# Patient Record
Sex: Female | Born: 1965
Health system: Southern US, Community
[De-identification: ages and names within clinical notes are randomized; demographics above are authoritative.]

## PROBLEM LIST (undated history)

## (undated) DIAGNOSIS — Z789 Other specified health status: Secondary | ICD-10-CM

## (undated) HISTORY — PX: NO PAST SURGERIES: SHX2092

---

## 2003-12-25 ENCOUNTER — Encounter: Admission: RE | Admit: 2003-12-25 | Discharge: 2003-12-25 | Payer: Self-pay | Admitting: Specialist

## 2004-09-07 ENCOUNTER — Ambulatory Visit: Payer: Self-pay | Admitting: General Practice

## 2005-09-06 ENCOUNTER — Ambulatory Visit: Payer: Self-pay | Admitting: Unknown Physician Specialty

## 2005-09-19 ENCOUNTER — Ambulatory Visit: Payer: Self-pay | Admitting: General Practice

## 2006-10-11 ENCOUNTER — Ambulatory Visit: Payer: Self-pay | Admitting: Family Medicine

## 2007-12-12 ENCOUNTER — Ambulatory Visit: Payer: Self-pay | Admitting: Family Medicine

## 2010-05-24 ENCOUNTER — Ambulatory Visit: Payer: Self-pay | Admitting: Family Medicine

## 2010-05-26 ENCOUNTER — Ambulatory Visit: Payer: Self-pay | Admitting: Family Medicine

## 2011-07-06 ENCOUNTER — Ambulatory Visit: Payer: Self-pay | Admitting: Family Medicine

## 2013-11-12 ENCOUNTER — Ambulatory Visit: Payer: Self-pay | Admitting: Family Medicine

## 2015-11-02 ENCOUNTER — Other Ambulatory Visit: Payer: Self-pay | Admitting: Family Medicine

## 2015-11-02 DIAGNOSIS — Z1231 Encounter for screening mammogram for malignant neoplasm of breast: Secondary | ICD-10-CM

## 2015-11-23 ENCOUNTER — Ambulatory Visit: Payer: Self-pay

## 2015-12-06 ENCOUNTER — Other Ambulatory Visit: Payer: Self-pay | Admitting: Family Medicine

## 2015-12-06 ENCOUNTER — Ambulatory Visit
Admission: RE | Admit: 2015-12-06 | Discharge: 2015-12-06 | Disposition: A | Discharge status: Home / Self Care | Payer: Commercial Managed Care - PPO | Source: Ambulatory Visit | Attending: Family Medicine | Admitting: Family Medicine

## 2015-12-06 DIAGNOSIS — Z1231 Encounter for screening mammogram for malignant neoplasm of breast: Secondary | ICD-10-CM | POA: Diagnosis not present

## 2016-03-25 ENCOUNTER — Encounter: Payer: Self-pay | Admitting: Emergency Medicine

## 2016-03-25 ENCOUNTER — Emergency Department
Admission: EM | Admit: 2016-03-25 | Discharge: 2016-03-25 | Disposition: A | Discharge status: Home / Self Care | Payer: Worker's Compensation | Attending: Student in an Organized Health Care Education/Training Program | Admitting: Student in an Organized Health Care Education/Training Program

## 2016-03-25 ENCOUNTER — Emergency Department: Payer: Worker's Compensation

## 2016-03-25 DIAGNOSIS — S61313A Laceration without foreign body of left middle finger with damage to nail, initial encounter: Secondary | ICD-10-CM | POA: Insufficient documentation

## 2016-03-25 DIAGNOSIS — R52 Pain, unspecified: Secondary | ICD-10-CM

## 2016-03-25 DIAGNOSIS — W319XXA Contact with unspecified machinery, initial encounter: Secondary | ICD-10-CM | POA: Diagnosis not present

## 2016-03-25 DIAGNOSIS — Y929 Unspecified place or not applicable: Secondary | ICD-10-CM | POA: Diagnosis not present

## 2016-03-25 DIAGNOSIS — Y990 Civilian activity done for income or pay: Secondary | ICD-10-CM | POA: Insufficient documentation

## 2016-03-25 DIAGNOSIS — Y9389 Activity, other specified: Secondary | ICD-10-CM | POA: Insufficient documentation

## 2016-03-25 MED ORDER — LIDOCAINE HCL (PF) 1 % IJ SOLN
10.0000 mL | Freq: Once | INTRAMUSCULAR | Status: AC
  Administered 2016-03-25: 10 mL
  Filled 2016-03-25: qty 10

## 2016-03-25 MED ORDER — HYDROCODONE-ACETAMINOPHEN 5-325 MG PO TABS: 1.0000 | ORAL_TABLET | ORAL | 0 refills | Status: DC | PRN

## 2016-03-25 MED ORDER — CEPHALEXIN 500 MG PO CAPS: 500.0000 mg | ORAL_CAPSULE | Freq: Three times a day (TID) | ORAL | 0 refills | Status: DC

## 2016-03-25 MED ORDER — HYDROCODONE-ACETAMINOPHEN 5-325 MG PO TABS
1.0000 | ORAL_TABLET | Freq: Once | ORAL | Status: AC
  Administered 2016-03-25: 1 via ORAL
  Filled 2016-03-25: qty 1

## 2016-03-25 NOTE — Discharge Instructions (Signed)
[facility] (Wound Care) Por favor vuelta en [place] puntadas/ grapas quitar o ms pronto si usted tiene preocupaciones.  Mantenga el rea limpia y seca por 24 horas. No quite el vendaje, si est aplicado.  Despus de 24 horas, quita el vendaje y limpia la herida suavemente con cualquier tipo de [place] y agua tibia. Reaplique un vendaje nuevo despus de limpiar herida, si est dirigido.  Limpie la herida con el jabn y agua 1 a 2 veces [facility] se [place].  No aplique ninguna ungentos ni cremas a la herida [employer] las puntadas estn en lugar, pues sta puede causar curativo retrasado.  Notifique la oficina si usted tiene el siguiente de los muestras de la infeccin: Hinchazn, enrojecimiento, calor, drenaje del pus, de la fiebre > 101.0 F  Notifique la oficina si usted tiene la sangra excesiva eso no para despus de 15-20 minutos de presin firme y Brewing technologist.

## 2016-03-25 NOTE — ED Notes (Signed)
See triage note   States she jammed her finger in a machine this am while at work laceration note to tip of left index finger

## 2016-03-25 NOTE — ED Notes (Signed)
Interpreter services paged.

## 2016-03-25 NOTE — ED Notes (Signed)
Left hand placed in saline soak

## 2016-03-25 NOTE — ED Triage Notes (Signed)
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

## 2016-03-25 NOTE — ED Provider Notes (Signed)
[facility] Emergency Department Provider Note  ____________________________________________   First MD Initiated Contact with Patient 03/25/16 [phone]     (approximate)  I have reviewed the triage vital signs and the nursing notes.   HISTORY  Chief Complaint Laceration Per patient and Spanish interpreter.  HPI Leslie Spencer is a 50 y.o. female is here with complaint of laceration to her left third finger. Patient states this is worker's comp injury as she was working on a machine in an attempt to fix it when her finger was pinched. Patient states that she has had a tetanus immunization within last 5 years. She denies any allergies to medications. Currently she rates her pain as a 9/10.   History reviewed. No pertinent past medical history.  There are no active problems to display for this patient.   History reviewed. No pertinent surgical history.  Prior to Admission medications   Medication Sig Start Date End Date Taking? Authorizing Provider  cephALEXin (KEFLEX) 500 MG capsule Take 1 capsule (500 mg total) by mouth 3 (three) times daily. 03/25/16   Johnn Hai, PA-C  HYDROcodone-acetaminophen (NORCO/VICODIN) 5-325 MG tablet Take 1 tablet by mouth every 4 (four) hours as needed for moderate pain. 03/25/16   Johnn Hai, PA-C    Allergies Patient has no known allergies.  No family history on file.  Social History Social History  Substance Use Topics  . Smoking status: Never Smoker  . Smokeless tobacco: Never Used  . Alcohol use No    Review of Systems Constitutional: No fever/chills Cardiovascular: Denies chest pain. Respiratory: Denies shortness of breath. Gastrointestinal:  No nausea, no vomiting.   Musculoskeletal: Positive for pain left third finger. Skin: Positive for laceration. Neurological: Negative for headaches, focal weakness or numbness.  10-point ROS otherwise  negative.  ____________________________________________   PHYSICAL EXAM:  VITAL SIGNS: ED Triage Vitals  Enc Vitals Group     BP 03/25/16 0733 (!) 155/79     Pulse Rate 03/25/16 0733 68     Resp 03/25/16 0733 18     Temp 03/25/16 0733 98.5 F (36.9 C)     Temp Source 03/25/16 0733 Oral     SpO2 03/25/16 0733 100 %     Weight 03/25/16 0737 160 lb (72.6 kg)     Height 03/25/16 0737 5' (1.524 m)     Head Circumference --      Peak Flow --      Pain Score 03/25/16 0738 9     Pain Loc --      Pain Edu? --      Excl. in [place]? --     Constitutional: Alert and oriented. Well appearing and in no acute distress. Eyes: Conjunctivae are normal. PERRL. EOMI. Head: Atraumatic. Nose: No congestion/rhinnorhea. Neck: No stridor.   Cardiovascular: Normal rate, regular rhythm. Grossly normal heart sounds.  Good peripheral circulation. Respiratory: Normal respiratory effort.  No retractions. Lungs CTAB. Musculoskeletal: Moves upper and lower extremities without any difficulty. This laceration to her left third finger distal aspect. Patient is able to flex and extend her finger without any difficulty. Motor sensory function is intact distally. The distal portion of the fingernail is avulsed but attached to soft tissue. There is no active bleeding at this time. No foreign body was noted. Neurologic:  Normal speech and language. No gross focal neurologic deficits are appreciated. No gait instability. Skin:  Skin is warm, dry. Psychiatric: Mood and affect are normal. Speech and behavior are normal.  ____________________________________________   LABS (all labs ordered are listed, but only abnormal results are displayed)  Labs Reviewed - No data to display  RADIOLOGY  Left third finger per radiologist: IMPRESSION:  Probable fracture through the tip of the distal phalanx of the left  middle finger. Densities within the overlying soft tissues could  reflect displaced bone fragments or foreign  bodies.   I, Johnn Hai, personally viewed and evaluated these images (plain radiographs) as part of my medical decision making, as well as reviewing the written report by the radiologist. ____________________________________________   PROCEDURES  Procedure(s) performed: LACERATION REPAIR Performed by: Johnn Hai Authorized by: Johnn Hai Consent: Verbal consent obtained. Risks and benefits: risks, benefits and alternatives were discussed Consent given by: patient Patient identity confirmed: provided demographic data Prepped and Draped in normal sterile fashion Wound explored  Laceration Location: Left third finger distal aspect  Laceration Length: 2.5 cm   No Foreign Bodies seen  Avulsed nail was removed from the skin  Local anesthetic: 1% lidocaine without epi    digital block   Anesthetic total  8  ml  Irrigation method: syringe Amount of cleaning: standard  Skin Closure with 4-0 Ethilon  Number of sutures: 5  Technique: Simple interrupted   Patient tolerance: Patient tolerated the procedure well with no immediate complications.  Procedures  Critical Care performed: No  ____________________________________________   INITIAL IMPRESSION / ASSESSMENT AND PLAN / ED COURSE  Pertinent labs & imaging results that were available during my care of the patient were reviewed by me and considered in my medical decision making (see chart for details).    Clinical Course    Patient tolerated procedure well. She .was given hydrocodone prior to her digital block. Area was soaked with surgical scrub for approximately 20-30 minutes. No active bleeding was present. Area was cleaned and sutured. 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 She is to follow-up with Dr. Mack Guise for follow-up of her finger injury  ____________________________________________   FINAL CLINICAL IMPRESSION(S) / ED  DIAGNOSES  Final diagnoses:  Laceration of left middle finger without foreign body with damage to nail, initial encounter      NEW MEDICATIONS STARTED DURING THIS VISIT:  Discharge Medication List as of 03/25/2016 10:12 AM    START taking these medications   Details  cephALEXin (KEFLEX) 500 MG capsule Take 1 capsule (500 mg total) by mouth 3 (three) times daily., Starting Sat 03/25/2016, Print    HYDROcodone-acetaminophen (NORCO/VICODIN) 5-325 MG tablet Take 1 tablet by mouth every 4 (four) hours as needed for moderate pain., Starting Sat 03/25/2016, Print         Note:  This document was prepared using Dragon voice recognition software and may include unintentional dictation errors.    Johnn Hai, PA-C 03/25/16 1619    Merlyn Lot, MD 03/25/16 [phone]

## 2016-04-03 ENCOUNTER — Encounter: Payer: Self-pay | Admitting: Emergency Medicine

## 2016-04-03 ENCOUNTER — Emergency Department
Admission: EM | Admit: 2016-04-03 | Discharge: 2016-04-03 | Disposition: A | Discharge status: Home / Self Care | Payer: Worker's Compensation | Attending: Emergency Medicine | Admitting: Emergency Medicine

## 2016-04-03 DIAGNOSIS — Z4802 Encounter for removal of sutures: Secondary | ICD-10-CM | POA: Diagnosis present

## 2016-04-03 NOTE — ED Provider Notes (Signed)
[facility] Emergency Department Provider Note  ____________________________________________  Time seen: Approximately 4:58 PM  I have reviewed the triage vital signs and the nursing notes.   HISTORY  Chief Complaint Suture / Staple Removal    HPI Leslie Spencer is a 51 y.o. female who presents emergency department for suture removal. Patient was seen in this department on 03/25/2016 and had 5 sutures placed into the third digit of her left hand. Patient states no cough occasions. She is here for suture removal. No complaints.   History reviewed. No pertinent past medical history.  There are no active problems to display for this patient.   History reviewed. No pertinent surgical history.  Prior to Admission medications   Medication Sig Start Date End Date Taking? Authorizing Provider  cephALEXin (KEFLEX) 500 MG capsule Take 1 capsule (500 mg total) by mouth 3 (three) times daily. 03/25/16   Johnn Hai, PA-C  HYDROcodone-acetaminophen (NORCO/VICODIN) 5-325 MG tablet Take 1 tablet by mouth every 4 (four) hours as needed for moderate pain. 03/25/16   Johnn Hai, PA-C    Allergies Patient has no known allergies.  History reviewed. No pertinent family history.  Social History Social History  Substance Use Topics  . Smoking status: Never Smoker  . Smokeless tobacco: Never Used  . Alcohol use No     Review of Systems  Constitutional: No fever/chills Cardiovascular: no chest pain. Respiratory: no cough. No SOB. Gastrointestinal: No abdominal pain.  No nausea, no vomiting.   Musculoskeletal: Negative for musculoskeletal pain. Skin:Sutured laceration to the third digit left hand. Neurological: Negative for headaches, focal weakness or numbness. 10-point ROS otherwise negative.  ____________________________________________   PHYSICAL EXAM:  VITAL SIGNS: ED Triage Vitals [04/03/16 1620]  Enc Vitals Group     BP 139/72   Pulse Rate 67     Resp 18     Temp 98.7 F (37.1 C)     Temp Source Oral     SpO2 98 %     Weight      Height      Head Circumference      Peak Flow      Pain Score      Pain Loc      Pain Edu?      Excl. in [place]?      Constitutional: Alert and oriented. Well appearing and in no acute distress. Eyes: Conjunctivae are normal. PERRL. EOMI. Head: Atraumatic. Neck: No stridor.    Cardiovascular: Normal rate, regular rhythm. Normal S1 and S2.  Good peripheral circulation. Respiratory: Normal respiratory effort without tachypnea or retractions. Lungs CTAB. Good air entry to the bases with no decreased or absent breath sounds. Musculoskeletal: Full range of motion to all extremities. No gross deformities appreciated. Neurologic:  Normal speech and language. No gross focal neurologic deficits are appreciated.  Skin:  Skin is warm, dry and intact. No rash noted.Sutured laceration noted to the third digit of the left hand. 5 sutures in place. No surrounding erythema or edema concerning for infection. Area is nontender to palpation. Psychiatric: Mood and affect are normal. Speech and behavior are normal. Patient exhibits appropriate insight and judgement.   ____________________________________________   LABS (all labs ordered are listed, but only abnormal results are displayed)  Labs Reviewed - No data to display ____________________________________________  EKG   ____________________________________________  RADIOLOGY   No results found.  ____________________________________________    PROCEDURES  Procedure(s) performed:    .Suture Removal Date/Time: 04/03/2016 5:08 PM Performed  by: CUTHRIELL, JONATHAN D Authorized by: Betha Loa D   Consent:    Consent obtained:  Verbal   Consent given by:  Patient   Risks discussed:  Bleeding, pain and wound separation Location:    Location:  Upper extremity   Upper extremity location:  Hand   Hand location:  L long  finger Procedure details:    Wound appearance:  Good wound healing   Number of sutures removed:  5 Post-procedure details:    Post-removal:  No dressing applied   Patient tolerance of procedure:  Tolerated well, no immediate complications      Medications - No data to display   ____________________________________________   INITIAL IMPRESSION / ASSESSMENT AND PLAN / ED COURSE  Pertinent labs & imaging results that were available during my care of the patient were reviewed by me and considered in my medical decision making (see chart for details).  Review of the [place] CSRS was performed in accordance of the [place] prior to dispensing any controlled drugs.  Clinical Course     Patient's diagnosis is consistent with Suture removal. Laceration is healing appropriately with no complications. 5 sutures are removed.Marland Kitchen She will follow-up primary care as needed Patient is given ED precautions to return to the ED for any worsening or new symptoms.     ____________________________________________  FINAL CLINICAL IMPRESSION(S) / ED DIAGNOSES  Final diagnoses:  Visit for suture removal      NEW MEDICATIONS STARTED DURING THIS VISIT:  New Prescriptions   No medications on file        This chart was dictated using voice recognition software/Dragon. Despite best efforts to proofread, errors can occur which can change the meaning. Any change was purely unintentional.    Darletta Moll, PA-C 04/03/16 1709    Eula Listen, MD 04/03/16 2322

## 2016-04-03 NOTE — ED Triage Notes (Signed)
Pt to ED for suture removal that were placed 1 week ago to left 3rd finger.  Pt denies pain or signs of infection.

## 2017-05-23 ENCOUNTER — Encounter (INDEPENDENT_AMBULATORY_CARE_PROVIDER_SITE_OTHER): Payer: Self-pay

## 2017-05-23 ENCOUNTER — Ambulatory Visit: Payer: PRIVATE HEALTH INSURANCE | Attending: Oncology | Admitting: *Deleted

## 2017-05-23 ENCOUNTER — Other Ambulatory Visit: Payer: Self-pay | Admitting: Family Medicine

## 2017-05-23 ENCOUNTER — Ambulatory Visit
Admission: RE | Admit: 2017-05-23 | Discharge: 2017-05-23 | Disposition: A | Discharge status: Home / Self Care | Payer: PRIVATE HEALTH INSURANCE | Source: Ambulatory Visit | Attending: Family Medicine | Admitting: Family Medicine

## 2017-05-23 DIAGNOSIS — Z1231 Encounter for screening mammogram for malignant neoplasm of breast: Secondary | ICD-10-CM | POA: Insufficient documentation

## 2017-05-23 DIAGNOSIS — Z0000 Encounter for general adult medical examination without abnormal findings: Secondary | ICD-10-CM

## 2017-05-23 DIAGNOSIS — R928 Other abnormal and inconclusive findings on diagnostic imaging of breast: Secondary | ICD-10-CM | POA: Insufficient documentation

## 2017-05-23 NOTE — Progress Notes (Signed)
Patient has insurance.  Not seen through [facility] today.  Sent for her screening mammogram at [facility].  Results to go primary care.

## 2017-05-28 ENCOUNTER — Other Ambulatory Visit: Payer: Self-pay | Admitting: Family Medicine

## 2017-05-28 DIAGNOSIS — N6489 Other specified disorders of breast: Secondary | ICD-10-CM

## 2017-05-28 DIAGNOSIS — R928 Other abnormal and inconclusive findings on diagnostic imaging of breast: Secondary | ICD-10-CM

## 2017-06-25 ENCOUNTER — Ambulatory Visit
Admission: RE | Admit: 2017-06-25 | Discharge: 2017-06-25 | Disposition: A | Discharge status: Home / Self Care | Payer: PRIVATE HEALTH INSURANCE | Source: Ambulatory Visit | Attending: Family Medicine | Admitting: Family Medicine

## 2017-06-25 DIAGNOSIS — N6489 Other specified disorders of breast: Secondary | ICD-10-CM | POA: Diagnosis present

## 2017-06-25 DIAGNOSIS — R928 Other abnormal and inconclusive findings on diagnostic imaging of breast: Secondary | ICD-10-CM

## 2019-01-15 ENCOUNTER — Ambulatory Visit: Payer: Self-pay | Attending: Oncology | Admitting: *Deleted

## 2019-01-15 ENCOUNTER — Encounter: Payer: Self-pay | Admitting: *Deleted

## 2019-01-15 ENCOUNTER — Other Ambulatory Visit: Payer: Self-pay

## 2019-01-15 ENCOUNTER — Ambulatory Visit
Admission: RE | Admit: 2019-01-15 | Discharge: 2019-01-15 | Disposition: A | Discharge status: Home / Self Care | Payer: Self-pay | Source: Ambulatory Visit | Attending: Oncology | Admitting: Oncology

## 2019-01-15 VITALS — BP 138/77 | HR 65 | Temp 98.3°F | Ht 63.0 in | Wt 165.0 lb

## 2019-01-15 DIAGNOSIS — Z0000 Encounter for general adult medical examination without abnormal findings: Secondary | ICD-10-CM

## 2019-01-15 NOTE — Progress Notes (Signed)
  Subjective:     Patient ID: Markeita Truchan, female   DOB: 12/22/1965, 53 y.o.   MRN: [id number]  HPI   Review of Systems     Objective:   Physical Exam Chest:     Breasts:        Right: No swelling, bleeding, inverted nipple, mass, nipple discharge, skin change or tenderness.        Left: No swelling, bleeding, inverted nipple, mass, nipple discharge, skin change or tenderness.    Lymphadenopathy:     Upper Body:     Right upper body: No supraclavicular or axillary adenopathy.     Left upper body: No supraclavicular or axillary adenopathy.        Assessment:     53 year old Hispanic female returns to [facility] for annual screening.  Lloyda, the interpreter present during the interview and exam.  On clinical breast exam bilateral breast have very dense thickening at upper outer quadrants.  There is no dominant mass, skin changes, nipple discharge or lymphadenopathy.  Taught self breast awareness.  Last pap on 10/23/18 was negative without HPV co-testing.  Next pap due in 2023.  Tyrer-Cuzick breast cancer risk assessment with a lifetime risk of 8.5%.  Per NCCN guidelines no further imagaing or genetic testing is recommended. Patient has been screened for eligibility.  She does not have any insurance, Medicare or Medicaid.  She also meets financial eligibility.  Hand-out given on the Affordable Care Act.    Plan:     Screening mammogram ordered.  Will follow up per [facility] protocol.

## 2019-01-21 ENCOUNTER — Encounter: Payer: Self-pay | Admitting: *Deleted

## 2019-01-22 ENCOUNTER — Ambulatory Visit (LOCAL_COMMUNITY_HEALTH_CENTER): Payer: Self-pay

## 2019-01-22 ENCOUNTER — Other Ambulatory Visit: Payer: Self-pay

## 2019-01-22 DIAGNOSIS — Z23 Encounter for immunization: Secondary | ICD-10-CM

## 2019-01-23 ENCOUNTER — Encounter: Payer: Self-pay | Admitting: *Deleted

## 2019-01-23 NOTE — Progress Notes (Signed)
Letter mailed from [facility] to inform patient of her normal mammogram results.  Patient is to follow-up with annual screening in one year.  HSIS to Christy. 

## 2021-01-19 ENCOUNTER — Ambulatory Visit: Payer: Self-pay | Admitting: Family Medicine

## 2021-01-20 ENCOUNTER — Encounter: Payer: Self-pay | Admitting: Nurse Practitioner

## 2021-01-20 ENCOUNTER — Other Ambulatory Visit: Payer: Self-pay

## 2021-01-20 ENCOUNTER — Ambulatory Visit (INDEPENDENT_AMBULATORY_CARE_PROVIDER_SITE_OTHER): Payer: BC Managed Care – PPO | Admitting: Nurse Practitioner

## 2021-01-20 VITALS — BP 127/79 | HR 63 | Temp 99.0°F | Ht 62.5 in | Wt 164.0 lb

## 2021-01-20 DIAGNOSIS — B351 Tinea unguium: Secondary | ICD-10-CM | POA: Diagnosis not present

## 2021-01-20 DIAGNOSIS — Z23 Encounter for immunization: Secondary | ICD-10-CM

## 2021-01-20 DIAGNOSIS — Z0000 Encounter for general adult medical examination without abnormal findings: Secondary | ICD-10-CM

## 2021-01-20 DIAGNOSIS — Z6829 Body mass index (BMI) 29.0-29.9, adult: Secondary | ICD-10-CM | POA: Diagnosis not present

## 2021-01-20 DIAGNOSIS — Z7689 Persons encountering health services in other specified circumstances: Secondary | ICD-10-CM

## 2021-01-20 DIAGNOSIS — Z136 Encounter for screening for cardiovascular disorders: Secondary | ICD-10-CM

## 2021-01-20 DIAGNOSIS — Z1231 Encounter for screening mammogram for malignant neoplasm of breast: Secondary | ICD-10-CM

## 2021-01-20 DIAGNOSIS — Z1211 Encounter for screening for malignant neoplasm of colon: Secondary | ICD-10-CM

## 2021-01-20 NOTE — Patient Instructions (Signed)
[facility] at [facility]  Address: [address] [place], [place], [place] [address]  Phone: [phone]   [place] es una [place]. Se hace para comprobar si hay cambios que no son normales. Este estudio permite buscar cambios que puedan estar causados por el cncer de mama u otros problemas. Las [employer] se Herbalist a las mujeres a Proofreader de los 39 aos. Un hombre puede hacerse una mamografa si tiene un bulto o hinchazn en la mama. Informe al mdico: Acerca de cualquier alergia que tenga. Si tiene implantes mamarios. Si ha tenido enfermedades, biopsias o cirugas de Scientist, research (medical). Si tiene antecedentes familiares de cncer de mama. Si est amamantando. Si est embarazada o podra estarlo. Cules son los riesgos? En general, se trata de un procedimiento seguro. Sin embargo, pueden ocurrir problemas, por ejemplo: Exponerse a la radiacin. En [employer], los [place] de radiacin son muy bajos. La necesidad de ms pruebas. Los resultados no se Chartered loss adjuster. Problemas para encontrar cncer de mama en mujeres con mamas densas. Qu ocurre antes de esta prueba? Hgase este estudio aproximadamente 1 o 2 semanas despus del perodo menstrual. Con frecuencia, es cuando las mamas estn menos sensibles. Si consulta a un mdico nuevo o cambia de Chief of Staff, enve las [employer] anteriores al [employer]. El da del examen, lave sus mamas y la zona de las [place]. No use desodorantes, perfumes, lociones o talcos el da del estudio. Qutese las alhajas del cuello. Use prendas que pueda ponerse y sacarse fcilmente. Qu ocurre durante la prueba?  Deber quitarse la ropa de la cintura para [place]. Se colocar una bata. Debe permanecer de pie delante de la mquina de radiografas. Se colocar cada mama entre dos placas de [place] unos segundos. Trate de  relajarse. Esto no causa ningn dao a las mamas. Puede que no resulte cmodo, pero ser muy breve. Se tomarn radiografas desde diferentes ngulos de cada mama. Este procedimiento puede variar segn el mdico y el hospital. Qu puedo esperar despus de la prueba? Ron Parker ser leda por un especialista (radilogo). Tal vez deba repetir [employer] del estudio. Esto depende de la calidad de las imgenes. Puede volver a sus actividades habituales. Es su responsabilidad retirar los [employer]. Pregunte cmo obtener sus resultados cuando estn listos. Resumen [employer] es una radiografa de las [place]. Permite buscar cambios que puedan estar causados por el cncer de mama u otros problemas. Un hombre puede [employer] examen si tiene un bulto o hinchazn en la mama. Antes del estudio, informe al mdico sobre cualquier problema en las mamas que haya tenido en el pasado. Hgase este estudio aproximadamente 1 o 2 semanas despus del perodo menstrual. Pregunte la fecha en que los resultados estarn disponibles. Asegrese de [employer]. Esta informacin no tiene Marine scientist el consejo del mdico. Asegrese de hacerle al mdico cualquier pregunta que tenga. Document Revised: 01/27/2020 Document Reviewed: 01/27/2020 Elsevier Patient Education  2022 [employer].

## 2021-01-20 NOTE — Assessment & Plan Note (Signed)
Ongoing issue with poor response to external treatment.  Will place referral to podiatry and check CMP and TSH today.

## 2021-01-20 NOTE — Progress Notes (Signed)
New Patient Office Visit  Subjective:  Patient ID: Leslie Spencer, female    DOB: 1965-06-30  Age: 55 y.o. MRN: [id number]  CC:  Chief Complaint  Patient presents with   Establish Care    Patient is here to establish care. Patient denies having any concerns at today's visit.    Skin Problem    Patient states says about 6 months ago she has a biopsy from her face in [place] and it was cancerous. Patient states she has the skin removed for Cosmetic purposes and then was informed that it was cancerous. Patient daughter states she had it removed here in [place] at Dermatology office. Patient still has her check up/follow up appointments.     HPI Leslie Spencer presents for new patient visit to establish care.  Introduced to Designer, jewellery role and practice setting.  All questions answered.  Discussed provider/patient relationship and expectations.   Previously followed by [facility] and last seen one year ago.  Verdis Frederickson, daughter, [phone], main contact.  Interpreter at bedside.  She wishes to obtain physical -- has not had in 2 years.  Reports last pap two years, noted in records 10/23/2018 = no HPV testing.  Has mammograms up to date, last was in November 2021 -- asymmetry in superior right breast, was rechecked -- to return in one year.    Has ongoing onychomycosis to left toenails and reports being treated with external lacquer from [facility] with no benefit.   SKIN LESION She is followed by dermatology for an area on her face that was cancerous. Duration: months Location: to face Painful: no Itching: no Onset: gradual Context: not changing Associated signs and symptoms:  History of skin cancer: yes History of precancerous skin lesions: no Family history of skin cancer: no   History reviewed. No pertinent past medical history.  Past Surgical History:  Procedure Laterality Date   CESAREAN SECTION     NO PAST SURGERIES      Family History  Problem Relation Age  of Onset   Heart disease Father     Social History   Socioeconomic History   Marital status: Married    Spouse name: Not on file   Number of children: Not on file   Years of education: Not on file   Highest education level: Not on file  Occupational History   Not on file  Tobacco Use   Smoking status: Never   Smokeless tobacco: Never  Substance and Sexual Activity   Alcohol use: No   Drug use: No   Sexual activity: Yes  Other Topics Concern   Not on file  Social History Narrative   Not on file   Social Determinants of Health   Financial Resource Strain: Low Risk    Difficulty of Paying Living Expenses: Not hard at all  Food Insecurity: No Food Insecurity   Worried About Charity fundraiser in the Last Year: Never true   Ran Out of Food in the Last Year: Never true  Transportation Needs: No Transportation Needs   Lack of Transportation (Medical): No   Lack of Transportation (Non-Medical): No  Physical Activity: Sufficiently Active   Days of Exercise per Week: 5 days   Minutes of Exercise per Session: 30 min  Stress: No Stress Concern Present   Feeling of Stress : Not at all  Social Connections: Moderately Isolated   Frequency of Communication with Friends and Family: More than three times a week   Frequency of Social Gatherings  with Friends and Family: More than three times a week   Attends Religious Services: Never   Marine scientist or Organizations: No   Attends Music therapist: Never   Marital Status: Married  Human resources officer Violence: Not At Risk   Fear of Current or Ex-Partner: No   Emotionally Abused: No   Physically Abused: No   Sexually Abused: No    ROS Review of Systems  Constitutional:  Negative for activity change, appetite change, diaphoresis, fatigue and fever.  Respiratory:  Negative for cough, chest tightness and shortness of breath.   Cardiovascular:  Negative for chest pain, palpitations and leg swelling.   Gastrointestinal: Negative.   Neurological:  Negative for dizziness, syncope, weakness, light-headedness, numbness and headaches.  Psychiatric/Behavioral: Negative.     Objective:   Today's Vitals: BP 127/79   Pulse 63   Temp 99 F (37.2 C) (Oral)   Ht 5' 2.5" (1.588 m)   Wt 164 lb (74.4 kg)   SpO2 98%   BMI 29.52 kg/m   Physical Exam Vitals and nursing note reviewed.  Constitutional:      General: She is awake. She is not in acute distress.    Appearance: She is well-developed and well-groomed. She is not ill-appearing or toxic-appearing.  HENT:     Head: Normocephalic.     Right Ear: Hearing normal.     Left Ear: Hearing normal.     Nose: Nose normal.  Eyes:     General: Lids are normal.        Right eye: No discharge.        Left eye: No discharge.     Conjunctiva/sclera: Conjunctivae normal.     Pupils: Pupils are equal, round, and reactive to light.  Neck:     Thyroid: No thyromegaly.     Vascular: No carotid bruit.  Cardiovascular:     Rate and Rhythm: Normal rate and regular rhythm.     Heart sounds: Normal heart sounds. No murmur heard.   No gallop.  Pulmonary:     Effort: Pulmonary effort is normal. No accessory muscle usage or respiratory distress.     Breath sounds: Normal breath sounds.  Abdominal:     General: Bowel sounds are normal.     Palpations: Abdomen is soft. There is no hepatomegaly or splenomegaly.  Musculoskeletal:     Cervical back: Normal range of motion and neck supple.     Right lower leg: No edema.     Left lower leg: No edema.  Lymphadenopathy:     Cervical: No cervical adenopathy.  Skin:    General: Skin is warm and dry.  Neurological:     Mental Status: She is alert and oriented to person, place, and time.     Deep Tendon Reflexes: Reflexes are normal and symmetric.     Reflex Scores:      Brachioradialis reflexes are 2+ on the right side and 2+ on the left side.      Patellar reflexes are 2+ on the right side and 2+ on the  left side. Psychiatric:        Attention and Perception: Attention normal.        Mood and Affect: Mood normal.        Speech: Speech normal.        Behavior: Behavior normal. Behavior is cooperative.        Thought Content: Thought content normal.    Assessment & Plan:   Problem List  Items Addressed This Visit       Musculoskeletal and Integument   Onychomycosis - Primary    Ongoing issue with poor response to external treatment.  Will place referral to podiatry and check CMP and TSH today.      Relevant Orders   Ambulatory referral to Podiatry     Other   BMI 29.0-29.9,adult    Recommended eating smaller high protein, low fat meals more frequently and exercising 30 mins a day 5 times a week with a goal of 10-15lb weight loss in the next 3 months. Patient voiced their understanding and motivation to adhere to these recommendations.       Other Visit Diagnoses     Encounter for screening mammogram for malignant neoplasm of breast       Mammogram ordered today   Relevant Orders   MM 3D SCREEN BREAST BILATERAL   Colon cancer screening       Referral to GI placed   Relevant Orders   Ambulatory referral to Gastroenterology   Flu vaccine need       Flu vaccine provided today   Relevant Orders   Flu Vaccine QUAD 6+ mos PF IM (Fluarix Quad PF) (Completed)   Encounter for screening for cardiovascular disorders       Lipid panel obtained.     Relevant Orders   Comprehensive metabolic panel   Lipid Panel w/o Chol/HDL Ratio   Encounter for annual physical exam       Annual physical today and health maintenance reviewed.   Relevant Orders   CBC with Differential/Platelet   TSH       Outpatient Encounter Medications as of 01/20/2021  Medication Sig   loratadine (CLARITIN) 10 MG tablet Take 10 mg by mouth daily.   [DISCONTINUED] cephALEXin (KEFLEX) 500 MG capsule Take 1 capsule (500 mg total) by mouth 3 (three) times daily. (Patient not taking: Reported on 01/20/2021)    [DISCONTINUED] HYDROcodone-acetaminophen (NORCO/VICODIN) 5-325 MG tablet Take 1 tablet by mouth every 4 (four) hours as needed for moderate pain. (Patient not taking: Reported on 01/20/2021)   No facility-administered encounter medications on file as of 01/20/2021.    Follow-up: Return in about 1 year (around 01/20/2022) for Annual physical with pap due.   Venita Lick, NP

## 2021-01-20 NOTE — Assessment & Plan Note (Signed)
Recommended eating smaller high protein, low fat meals more frequently and exercising 30 mins a day 5 times a week with a goal of 10-15lb weight loss in the next 3 months. Patient voiced their understanding and motivation to adhere to these recommendations.  

## 2021-01-21 ENCOUNTER — Other Ambulatory Visit: Payer: Self-pay

## 2021-01-21 DIAGNOSIS — Z1211 Encounter for screening for malignant neoplasm of colon: Secondary | ICD-10-CM

## 2021-01-21 LAB — CBC WITH DIFFERENTIAL/PLATELET
Basophils Absolute: 0.1 10*3/uL (ref 0.0–0.2)
Basos: 1 %
EOS (ABSOLUTE): 0.1 10*3/uL (ref 0.0–0.4)
Eos: 2 %
Hematocrit: 42.6 % (ref 34.0–46.6)
Hemoglobin: 14.5 g/dL (ref 11.1–15.9)
Immature Grans (Abs): 0 10*3/uL (ref 0.0–0.1)
Immature Granulocytes: 0 %
Lymphocytes Absolute: 1.9 10*3/uL (ref 0.7–3.1)
Lymphs: 27 %
MCH: 30.7 pg (ref 26.6–33.0)
MCHC: 34 g/dL (ref 31.5–35.7)
MCV: 90 fL (ref 79–97)
Monocytes Absolute: 0.5 10*3/uL (ref 0.1–0.9)
Monocytes: 7 %
Neutrophils Absolute: 4.6 10*3/uL (ref 1.4–7.0)
Neutrophils: 63 %
Platelets: 311 10*3/uL (ref 150–450)
RBC: 4.73 x10E6/uL (ref 3.77–5.28)
RDW: 12.1 % (ref 11.7–15.4)
WBC: 7.2 10*3/uL (ref 3.4–10.8)

## 2021-01-21 LAB — COMPREHENSIVE METABOLIC PANEL
ALT: 24 IU/L (ref 0–32)
AST: 31 IU/L (ref 0–40)
Albumin/Globulin Ratio: 2.1 (ref 1.2–2.2)
Albumin: 4.8 g/dL (ref 3.8–4.9)
Alkaline Phosphatase: 111 IU/L (ref 44–121)
BUN/Creatinine Ratio: 24 — ABNORMAL HIGH (ref 9–23)
BUN: 15 mg/dL (ref 6–24)
Bilirubin Total: 0.3 mg/dL (ref 0.0–1.2)
CO2: 24 mmol/L (ref 20–29)
Calcium: 9.7 mg/dL (ref 8.7–10.2)
Chloride: 101 mmol/L (ref 96–106)
Creatinine, Ser: 0.62 mg/dL (ref 0.57–1.00)
Globulin, Total: 2.3 g/dL (ref 1.5–4.5)
Glucose: 103 mg/dL — ABNORMAL HIGH (ref 70–99)
Potassium: 4.4 mmol/L (ref 3.5–5.2)
Sodium: 140 mmol/L (ref 134–144)
Total Protein: 7.1 g/dL (ref 6.0–8.5)
eGFR: 104 mL/min/{1.73_m2} (ref >59)

## 2021-01-21 LAB — TSH: TSH: 1.51 u[IU]/mL (ref 0.450–4.500)

## 2021-01-21 LAB — LIPID PANEL W/O CHOL/HDL RATIO
Cholesterol, Total: 272 mg/dL — ABNORMAL HIGH (ref 100–199)
HDL: 59 mg/dL (ref >39)
LDL Chol Calc (NIH): 173 mg/dL — ABNORMAL HIGH (ref 0–99)
Triglycerides: 216 mg/dL — ABNORMAL HIGH (ref 0–149)
VLDL Cholesterol Cal: 40 mg/dL (ref 5–40)

## 2021-01-21 MED ORDER — NA SULFATE-K SULFATE-MG SULF 17.5-3.13-1.6 GM/177ML PO SOLN: 1.0000 | Freq: Once | ORAL | 0 refills | Status: AC

## 2021-01-21 NOTE — Progress Notes (Signed)
Contacted via [place] The 10-year ASCVD risk score (Arnett DK, et al., 2019) is: 2.8%   Values used to calculate the score:     Age: 55 years     Sex: Female     Is Non-Hispanic African American: No     Diabetic: No     Tobacco smoker: No     Systolic Blood Pressure: 832 mmHg     Is BP treated: No     HDL Cholesterol: 59 mg/dL     Total Cholesterol: 272 mg/dL     [place], tus laboratorios han regresado y en general se ven fantsticos con la excepcin de los niveles de [place]. Su colesterol es alto, pero recomendaciones para hacer cambios de estilo de vida. Su LDL est por encima de lo normal. El LDL es el colesterol malo. Con el tiempo y en combinacin con la inflamacin y otros factores, esto contribuye a la formacin de placa que, a su vez, puede provocar un derrame cerebral y/o un ataque al corazn en el futuro. A veces, el LDL alto es principalmente gentico, y las personas pueden estar comiendo todos los alimentos correctos pero an tienen nmeros altos. Otras veces, hay margen para mejorar la dieta y comer ms sano puede reducir este nmero y reducir potencialmente el riesgo de ataque cardaco y/o accidente cerebrovascular.  Para reducir su LDL, recuerde: ms frutas y verduras, ms pescado y limite las carnes rojas y los productos lcteos. Ms soya, nueces, frijoles, cebada, lentejas, avena y [place] enriquecida con steres de Teaching laboratory technician de [place]. Tambin animo a eliminar el azcar y los alimentos procesados. Recuerde, compre afuera de la tienda de comestibles y visite su Personal assistant. Si desea hablar conmigo [facility] dieta para su colesterol, hgamelo saber. Deberamos volver a Chartered loss adjuster en 12 meses. Alguna pregunta? Sigue siendo genial!! Gracias por permitirme participar en su cuidado. Te aprecio. Saludos cordiales, [place] Streat

## 2021-01-21 NOTE — Progress Notes (Signed)
Gastroenterology Pre-Procedure Review  Request Date: 01/27/21 Requesting Physician: Dr. Allen Norris   PATIENT REVIEW QUESTIONS: The patient responded to the following health history questions as indicated:  Please contact patients daughter Verdis Frederickson. Daughter answered all questions for patient.  1. Are you having any GI issues? no 2. Do you have a personal history of Polyps? no 3. Do you have a family history of Colon Cancer or Polyps? no 4. Diabetes Mellitus? no 5. Joint replacements in the past 12 months?no 6. Major health problems in the past 3 months?no 7. Any artificial heart valves, MVP, or defibrillator?no    MEDICATIONS & ALLERGIES:    Patient reports the following regarding taking any anticoagulation/antiplatelet therapy:   Plavix, Coumadin, Eliquis, Xarelto, Lovenox, Pradaxa, Brilinta, or Effient? no Aspirin? no  Patient confirms/reports the following medications:  Current Outpatient Medications  Medication Sig Dispense Refill   loratadine (CLARITIN) 10 MG tablet Take 10 mg by mouth daily.     No current facility-administered medications for this visit.    Patient confirms/reports the following allergies:  No Known Allergies  No orders of the defined types were placed in this encounter.   AUTHORIZATION INFORMATION Primary Insurance: 1D#: Group #:  Secondary Insurance: 1D#: Group #:  SCHEDULE INFORMATION: Date: 01/27/21  Time: Location: [place]

## 2021-01-24 ENCOUNTER — Encounter: Payer: Self-pay | Admitting: Gastroenterology

## 2021-01-27 ENCOUNTER — Ambulatory Visit: Payer: BC Managed Care – PPO | Admitting: Anesthesiology

## 2021-01-27 ENCOUNTER — Other Ambulatory Visit: Payer: Self-pay

## 2021-01-27 ENCOUNTER — Encounter
Admission: RE | Disposition: A | Discharge status: Home / Self Care | Payer: Self-pay | Source: Ambulatory Visit | Attending: Gastroenterology

## 2021-01-27 ENCOUNTER — Encounter: Payer: Self-pay | Admitting: Gastroenterology

## 2021-01-27 ENCOUNTER — Ambulatory Visit
Admission: RE | Admit: 2021-01-27 | Discharge: 2021-01-27 | Disposition: A | Discharge status: Home / Self Care | Payer: BC Managed Care – PPO | Source: Ambulatory Visit | Attending: Gastroenterology | Admitting: Gastroenterology

## 2021-01-27 DIAGNOSIS — D124 Benign neoplasm of descending colon: Secondary | ICD-10-CM | POA: Diagnosis not present

## 2021-01-27 DIAGNOSIS — K640 First degree hemorrhoids: Secondary | ICD-10-CM | POA: Diagnosis not present

## 2021-01-27 DIAGNOSIS — K635 Polyp of colon: Secondary | ICD-10-CM | POA: Diagnosis not present

## 2021-01-27 DIAGNOSIS — Z1211 Encounter for screening for malignant neoplasm of colon: Secondary | ICD-10-CM | POA: Diagnosis not present

## 2021-01-27 DIAGNOSIS — D122 Benign neoplasm of ascending colon: Secondary | ICD-10-CM | POA: Insufficient documentation

## 2021-01-27 HISTORY — PX: COLONOSCOPY WITH PROPOFOL: SHX5780

## 2021-01-27 HISTORY — PX: POLYPECTOMY: SHX5525

## 2021-01-27 HISTORY — DX: Other specified health status: Z78.9

## 2021-01-27 LAB — POCT PREGNANCY, URINE: Preg Test, Ur: NEGATIVE

## 2021-01-27 SURGERY — COLONOSCOPY WITH PROPOFOL
Anesthesia: General | Site: Rectum

## 2021-01-27 MED ORDER — ACETAMINOPHEN 325 MG PO TABS: 325.0000 mg | ORAL_TABLET | ORAL | Status: DC | PRN

## 2021-01-27 MED ORDER — STERILE WATER FOR IRRIGATION IR SOLN
Status: DC | PRN
  Administered 2021-01-27: 250 mL

## 2021-01-27 MED ORDER — SODIUM CHLORIDE 0.9 % IV SOLN: INTRAVENOUS | Status: DC

## 2021-01-27 MED ORDER — ACETAMINOPHEN 160 MG/5ML PO SOLN: 325.0000 mg | ORAL | Status: DC | PRN

## 2021-01-27 MED ORDER — LACTATED RINGERS IV SOLN: INTRAVENOUS | Status: DC

## 2021-01-27 MED ORDER — ONDANSETRON HCL 4 MG/2ML IJ SOLN: 4.0000 mg | Freq: Once | INTRAMUSCULAR | Status: DC | PRN

## 2021-01-27 MED ORDER — PROPOFOL 10 MG/ML IV BOLUS
INTRAVENOUS | Status: DC | PRN
  Administered 2021-01-27: 80 mg via INTRAVENOUS
  Administered 2021-01-27 (×7): 20 mg via INTRAVENOUS

## 2021-01-27 MED ORDER — LIDOCAINE HCL (CARDIAC) PF 100 MG/5ML IV SOSY
PREFILLED_SYRINGE | INTRAVENOUS | Status: DC | PRN
  Administered 2021-01-27: 50 mg via INTRAVENOUS

## 2021-01-27 SURGICAL SUPPLY — 8 items
GOWN CVR UNV OPN BCK APRN NK (MISCELLANEOUS) ×4 IMPLANT
GOWN ISOL THUMB LOOP REG UNIV (MISCELLANEOUS) ×6
KIT PRC NS LF DISP ENDO (KITS) ×2 IMPLANT
KIT PROCEDURE OLYMPUS (KITS) ×3
MANIFOLD NEPTUNE II (INSTRUMENTS) ×3 IMPLANT
SNARE COLD EXACTO (MISCELLANEOUS) ×3 IMPLANT
TRAP ETRAP POLY (MISCELLANEOUS) ×2 IMPLANT
WATER STERILE IRR 250ML POUR (IV SOLUTION) ×3 IMPLANT

## 2021-01-27 NOTE — Transfer of Care (Signed)
Immediate Anesthesia Transfer of Care Note  Patient: Leslie Spencer  Procedure(s) Performed: COLONOSCOPY WITH PROPOFOL (Rectum) POLYPECTOMY (Rectum)  Patient Location: PACU  Anesthesia Type: General  Level of Consciousness: awake, alert  and patient cooperative  Airway and Oxygen Therapy: Patient Spontanous Breathing and Patient connected to supplemental oxygen  Post-op Assessment: Post-op Vital signs reviewed, Patient's Cardiovascular Status Stable, Respiratory Function Stable, Patent Airway and No signs of Nausea or vomiting  Post-op Vital Signs: Reviewed and stable  Complications: No notable events documented.

## 2021-01-27 NOTE — H&P (Signed)
Leslie Lame, MD [facility] [address], [place],  [address] Phone: [phone] Fax : [phone]  Primary Care Physician:  Venita Lick, NP Primary Gastroenterologist:  Dr. Allen Norris  Pre-Procedure History & Physical: HPI:  Leslie Spencer is a 55 y.o. female is here for a screening colonoscopy.   Past Medical History:  Diagnosis Date   Medical history non-contributory     Past Surgical History:  Procedure Laterality Date   CESAREAN SECTION      Prior to Admission medications   Medication Sig Start Date End Date Taking? Authorizing Provider  COLLAGEN PO Take by mouth daily.   Yes [provider]  loratadine (CLARITIN) 10 MG tablet Take 10 mg by mouth daily.   Yes [provider]    Allergies as of 01/21/2021   (No Known Allergies)    Family History  Problem Relation Age of Onset   Heart disease Father     Social History   Socioeconomic History   Marital status: Married    Spouse name: Not on file   Number of children: Not on file   Years of education: Not on file   Highest education level: Not on file  Occupational History   Not on file  Tobacco Use   Smoking status: Never   Smokeless tobacco: Never  Vaping Use   Vaping Use: Never used  Substance and Sexual Activity   Alcohol use: No   Drug use: No   Sexual activity: Yes  Other Topics Concern   Not on file  Social History Narrative   Not on file   Social Determinants of Health   Financial Resource Strain: Low Risk    Difficulty of Paying Living Expenses: Not hard at all  Food Insecurity: No Food Insecurity   Worried About Charity fundraiser in the Last Year: Never true   [place] in the Last Year: Never true  Transportation Needs: No Transportation Needs   Lack of Transportation (Medical): No   Lack of Transportation (Non-Medical): No  Physical Activity: Sufficiently Active   Days of Exercise per Week: 5 days   Minutes of Exercise per Session: 30 min   Stress: No Stress Concern Present   Feeling of Stress : Not at all  Social Connections: Moderately Isolated   Frequency of Communication with Friends and Family: More than three times a week   Frequency of Social Gatherings with Friends and Family: More than three times a week   Attends Religious Services: Never   Marine scientist or Organizations: No   Attends Music therapist: Never   Marital Status: Married  Human resources officer Violence: Not At Risk   Fear of Current or Ex-Partner: No   Emotionally Abused: No   Physically Abused: No   Sexually Abused: No    Review of Systems: See HPI, otherwise negative ROS  Physical Exam: BP (!) 149/77   Pulse 64   Temp 97.8 F (36.6 C) (Temporal)   Ht 5' 2.5" (1.588 m)   Wt 72.6 kg   LMP 08/08/2020 (Approximate)   SpO2 97%   BMI 28.80 kg/m  General:   Alert,  pleasant and cooperative in NAD Head:  Normocephalic and atraumatic. Neck:  Supple; no masses or thyromegaly. Lungs:  Clear throughout to auscultation.    Heart:  Regular rate and rhythm. Abdomen:  Soft, nontender and nondistended. Normal bowel sounds, without guarding, and without rebound.   Neurologic:  Alert and  oriented x4;  grossly normal neurologically.  Impression/Plan: Leslie Spencer is now here to undergo a screening colonoscopy.  Risks, benefits, and alternatives regarding colonoscopy have been reviewed with the patient.  Questions have been answered.  All parties agreeable.

## 2021-01-27 NOTE — Op Note (Signed)
[facility]astroenter[facility] Patient Name: Leslie Spencer Procedure Date: 01/27/2021 10:39 AM MRN: [id number] Account #: [id number] Date of Birth: 02-08-1966 Admit Type: Outpatient Age: 55 Room: [facility] OR ROOM 01 Gender: Female Note Status: Finalized Instrument Name: [id number] Procedure:             Colonoscopy Indications:           Screening for colorectal malignant neoplasm Providers:             Lucilla Lame MD, MD Referring MD:          Barbaraann Faster. Cannady (Referring MD) Medicines:             Propofol per Anesthesia Complications:         No immediate complications. Procedure:             Pre-Anesthesia Assessment:                        - Prior to the procedure, a History and Physical was                         performed, and patient medications and allergies were                         reviewed. The patient's tolerance of previous                         anesthesia was also reviewed. The risks and benefits                         of the procedure and the sedation options and risks                         were discussed with the patient. All questions were                         answered, and informed consent was obtained. Prior                         Anticoagulants: The patient has taken no previous                         anticoagulant or antiplatelet agents. ASA Grade                         Assessment: II - A patient with mild systemic disease.                         After reviewing the risks and benefits, the patient                         was deemed in satisfactory condition to undergo the                         procedure.                        After obtaining informed consent, the colonoscope was  passed under direct vision. Throughout the procedure,                         the patient's blood pressure, pulse, and oxygen                         saturations were monitored continuously. The                         Colonoscope  was introduced through the anus and                         advanced to the the cecum, identified by appendiceal                         orifice and ileocecal valve. The colonoscopy was                         performed without difficulty. The patient tolerated                         the procedure well. The quality of the bowel                         preparation was excellent. Findings:      The perianal and digital rectal examinations were normal.      Two sessile polyps were found in the ascending colon. The polyps were 3       to 7 mm in size. These polyps were removed with a cold snare. Resection       and retrieval were complete.      A 3 mm polyp was found in the descending colon. The polyp was sessile.       The polyp was removed with a cold snare. Resection and retrieval were       complete.      Non-bleeding internal hemorrhoids were found during retroflexion. The       hemorrhoids were Grade I (internal hemorrhoids that do not prolapse). Impression:            - Two 3 to 7 mm polyps in the ascending colon, removed                         with a cold snare. Resected and retrieved.                        - One 3 mm polyp in the descending colon, removed with                         a cold snare. Resected and retrieved.                        - Non-bleeding internal hemorrhoids. Recommendation:        - Discharge patient to home.                        - Resume previous diet.                        - Continue present medications.                        -  Await pathology results.                        - If the pathology report reveals adenomatous tissue,                         then repeat the colonoscopy for surveillance in 5                         years. Procedure Code(s):     --- Professional ---                        [phone], Colonoscopy, flexible; with removal of                         tumor(s), polyp(s), or other lesion(s) by snare                          technique Diagnosis Code(s):     --- Professional ---                        Z12.11, Encounter for screening for malignant neoplasm                         of colon                        K63.5, Polyp of colon CPT copyright 2019 American Medical Association. All rights reserved. The codes documented in this report are preliminary and upon coder review may  be revised to meet current compliance requirements. Lucilla Lame MD, MD 01/27/2021 11:03:04 AM This report has been signed electronically. Number of Addenda: 0 Note Initiated On: 01/27/2021 10:39 AM Scope Withdrawal Time: 0 hours 8 minutes 26 seconds  Total Procedure Duration: 0 hours 12 minutes 30 seconds  Estimated Blood Loss:  Estimated blood loss: none.      [facility]

## 2021-01-27 NOTE — Anesthesia Postprocedure Evaluation (Signed)
Anesthesia Post Note  Patient: Leslie Spencer  Procedure(s) Performed: COLONOSCOPY WITH PROPOFOL (Rectum) POLYPECTOMY (Rectum)     Patient location during evaluation: PACU Anesthesia Type: General Level of consciousness: awake Pain management: pain level controlled Vital Signs Assessment: post-procedure vital signs reviewed and stable Respiratory status: respiratory function stable Cardiovascular status: stable Postop Assessment: no signs of nausea or vomiting Anesthetic complications: no   No notable events documented.  Veda Canning

## 2021-01-27 NOTE — Anesthesia Preprocedure Evaluation (Addendum)
Anesthesia Evaluation  Patient identified by MRN, date of birth, ID band Patient awake    Reviewed: Allergy & Precautions, NPO status   Airway Mallampati: II  TM Distance: >3 FB     Dental   Pulmonary neg pulmonary ROS,    Pulmonary exam normal        Cardiovascular negative cardio ROS   Rhythm:Regular Rate:Normal     Neuro/Psych    GI/Hepatic negative GI ROS,   Endo/Other    Renal/GU      Musculoskeletal   Abdominal   Peds  Hematology   Anesthesia Other Findings   Reproductive/Obstetrics                             Anesthesia Physical Anesthesia Plan  ASA: 1  Anesthesia Plan: General   Post-op Pain Management:    Induction: Intravenous  PONV Risk Score and Plan: Propofol infusion, TIVA and Treatment may vary due to age or medical condition  Airway Management Planned: Natural Airway and Nasal Cannula  Additional Equipment:   Intra-op Plan:   Post-operative Plan:   Informed Consent: I have reviewed the patients History and Physical, chart, labs and discussed the procedure including the risks, benefits and alternatives for the proposed anesthesia with the patient or authorized representative who has indicated his/her understanding and acceptance.       Plan Discussed with: CRNA  Anesthesia Plan Comments:         Anesthesia Quick Evaluation

## 2021-01-27 NOTE — Anesthesia Procedure Notes (Signed)
Date/Time: 01/27/2021 10:47 AM Performed by: Mayme Genta, CRNA Pre-anesthesia Checklist: Patient identified, Emergency Drugs available, Suction available, Timeout performed and Patient being monitored Patient Re-evaluated:Patient Re-evaluated prior to induction Oxygen Delivery Method: Nasal cannula Placement Confirmation: positive ETCO2

## 2021-01-28 ENCOUNTER — Encounter: Payer: Self-pay | Admitting: Gastroenterology

## 2021-01-28 LAB — SURGICAL PATHOLOGY

## 2021-01-31 ENCOUNTER — Encounter: Payer: Self-pay | Admitting: Gastroenterology

## 2021-02-04 ENCOUNTER — Ambulatory Visit: Payer: Self-pay | Admitting: Podiatry

## 2021-02-10 ENCOUNTER — Encounter: Payer: Self-pay | Admitting: Nurse Practitioner

## 2021-02-25 ENCOUNTER — Encounter: Payer: Self-pay | Admitting: Nurse Practitioner

## 2021-02-25 MED ORDER — LORATADINE 10 MG PO TABS: 10.0000 mg | ORAL_TABLET | Freq: Every day | ORAL | 4 refills | Status: DC

## 2021-04-15 ENCOUNTER — Ambulatory Visit: Payer: Self-pay | Admitting: Podiatry

## 2021-04-26 ENCOUNTER — Other Ambulatory Visit: Payer: Self-pay

## 2021-04-26 ENCOUNTER — Ambulatory Visit
Admission: RE | Admit: 2021-04-26 | Discharge: 2021-04-26 | Disposition: A | Discharge status: Home / Self Care | Payer: BC Managed Care – PPO | Source: Ambulatory Visit | Attending: Nurse Practitioner | Admitting: Nurse Practitioner

## 2021-04-26 DIAGNOSIS — Z1231 Encounter for screening mammogram for malignant neoplasm of breast: Secondary | ICD-10-CM | POA: Diagnosis not present

## 2021-04-27 ENCOUNTER — Inpatient Hospital Stay
Admission: RE | Admit: 2021-04-27 | Discharge: 2021-04-27 | Disposition: A | Discharge status: Home / Self Care | Payer: Self-pay | Source: Ambulatory Visit | Attending: *Deleted | Admitting: *Deleted

## 2021-04-27 ENCOUNTER — Other Ambulatory Visit: Payer: Self-pay | Admitting: *Deleted

## 2021-04-27 DIAGNOSIS — Z1231 Encounter for screening mammogram for malignant neoplasm of breast: Secondary | ICD-10-CM

## 2021-04-27 NOTE — Progress Notes (Signed)
Contacted via MyChart   Normal mammogram, may repeat in one year:)

## 2021-06-22 DIAGNOSIS — Z85828 Personal history of other malignant neoplasm of skin: Secondary | ICD-10-CM | POA: Diagnosis not present

## 2021-06-22 DIAGNOSIS — L578 Other skin changes due to chronic exposure to nonionizing radiation: Secondary | ICD-10-CM | POA: Diagnosis not present

## 2022-01-20 DIAGNOSIS — E7800 Pure hypercholesterolemia, unspecified: Secondary | ICD-10-CM | POA: Insufficient documentation

## 2022-01-20 DIAGNOSIS — R7301 Impaired fasting glucose: Secondary | ICD-10-CM | POA: Insufficient documentation

## 2022-01-20 NOTE — Patient Instructions (Signed)
Prevencin del colesterol alto Preventing High Cholesterol El colesterol es una sustancia blanca y [place], similar a la grasa, que el cuerpo humano necesita para ayudar a Librarian, academic. El hgado fabrica todo el colesterol que el cuerpo de una persona necesita. Tener el colesterol alto (hipercolesterolemia) aumenta el riesgo de sufrir enfermedades cardacas y accidentes cerebrovasculares. El exceso de colesterol o el colesterol adicional proviene de los alimentos que comemos. El colesterol alto con frecuencia puede prevenirse con cambios en la dieta y en el estilo de vida. Si ya tiene Orthoptist, puede controlarlo haciendo cambios en la dieta y en el estilo de vida, y con medicamentos. Cmo me puede afectar el colesterol alto? Si tiene Orthoptist, se pueden acumular depsitos de esta sustancia (placas) en las paredes de los vasos sanguneos. Los vasos sanguneos que transportan la sangre desde el corazn al resto del cuerpo se denominan arterias. Las Occupational hygienist y la rigidez [place] arterias. Esto a su vez puede: Limitar u obstruir el flujo sanguneo y causar la formacin de cogulos de [place]. Aumentar el riesgo de sufrir un infarto de miocardio y un accidente cerebrovascular. Qu factores pueden aumentar mi riesgo de colesterol alto? Es ms probable que Orthoptist en personas que: Consumen alimentos con alto contenido de grasa saturada o colesterol. Las grasas saturadas se encuentran principalmente en los alimentos de origen animal. Tienen sobrepeso. No hacen suficiente ejercicio fsico. Usan productos que contienen nicotina o tabaco, como cigarrillos, cigarrillos electrnicos y tabaco de Higher education careers adviser. Tienen antecedentes familiares de colesterol alto (hipercolesterolemia familiar). Qu medidas de prevencin puedo tomar? Nutricin  Coma menos grasas saturadas. Evite las grasas trans (aceites parcialmente hidrogenados). Estas suelen encontrarse  en la margarina y en algunos productos horneados, alimentos fritos y refrigerios comprados en paquete. Evite la carne precocinada o curada, como tocino, salchichas o panes de carne. Evite alimentos y bebidas que tengan azcares agregados. Consuma ms frutas, verduras y cereales integrales. Elija fuentes saludables de protenas, como pescado, carne de ave, cortes magros de carnes rojas, frijoles, guisantes, lentejas y nueces. Elija fuentes saludables de grasas, por ejemplo: Frutos secos. Aceites vegetales, en particular el aceite de oliva. Pescados que contengan grasas saludables, como los cidos grasos omega 3. Estos pescados incluyen la caballa y el salmn. Estilo de vida Baje de peso si es necesario. Mantener un ndice de masa corporal [facility]) saludable puede ayudar a prevenir o Artist. Tambin puede disminuir el riesgo de diabetes y de presin arterial alta. Pdale al mdico que lo ayude a disear un plan de dieta y ejercicio para bajar de peso de [place] segura. No consuma ningn producto que contenga nicotina o tabaco. Estos productos incluyen cigarrillos, tabaco para Higher education careers adviser y aparatos de vapeo, como los Psychologist, sport and exercise. Si necesita ayuda para dejar de consumir estos productos, consulte al [employer]. Consumo de alcohol No beba alcohol si: Su mdico le indica no hacerlo. Est embarazada, puede estar embarazada o est tratando de [place]. Si bebe alcohol: Limite la cantidad que bebe a lo siguiente: De 0 a 1 medida por da para las mujeres. De 0 a 2 medidas por da para los hombres. Sepa cunta cantidad de alcohol hay en las bebidas que toma. En los [place], una medida equivale a una botella de cerveza de 12 oz (355 ml), un vaso de vino de 5 oz (148 ml) o un vaso de una bebida alcohlica de alta graduacin de 1 oz (44 ml). Actividad  Ejerctese lo suficiente. Haga ejercicio como  se lo haya indicado el mdico. Cada semana, haga al menos 150 minutos  de ejercicio que requiera un nivel medio de esfuerzo (ejercicio de intensidad [place]). Este tipo de ejercicio: Hace que el corazn lata ms rpido, pero hace posible que pueda hablar mientras lo realiza. Puede hacerse en sesiones cortas varias veces al da o en sesiones ms largas varias veces por semana. Por ejemplo, en 5 das por semana, podra caminar rpido o andar en bicicleta 3 veces por da durante 10 minutos cada vez. Medicamentos El mdico puede recomendarle medicamentos para ayudar a Musician. Este puede ser un medicamento para reducir la cantidad de colesterol que produce el hgado. Es posible que necesite tomar medicamentos si: Los cambios en la dieta y el estilo de vida no han reducido lo suficiente el colesterol. Tiene colesterol alto y presenta otros factores de riesgo de sufrir enfermedades cardacas o accidentes cerebrovasculares. Use los medicamentos de venta libre y los recetados solamente como se lo haya indicado el mdico. Informacin general Controle los factores de riesgo del colesterol alto. Hable con el mdico acerca de todos los factores de riesgo y cmo reducir Catering manager. Controle otras afecciones que pueda tener, por ejemplo diabetes o presin arterial alta (hipertensin). Hgase anlisis de sangre para [employer] niveles de colesterol de [place] peridica, segn las indicaciones del mdico. Dallie Dad a todas las visitas de seguimiento. Esto es importante. Dnde buscar ms informacin American Heart Association (Asociacin Estadounidense del [place]): www.heart.org National Heart, Lung, and Blood Institute ([place], los Pulmones y Herbalist): https://wilson-eaton.com/ Resumen El colesterol alto aumenta el riesgo de sufrir enfermedades cardacas y accidentes cerebrovasculares. Si mantiene el colesterol bajo, puede reducir el riesgo de tener estas afecciones. El colesterol alto con frecuencia puede prevenirse con cambios en la dieta y en el  estilo de vida. Consulte al mdico para controlar los factores de riesgo y hgase anlisis de sangre con regularidad. Esta informacin no tiene Marine scientist el consejo del mdico. Asegrese de hacerle al mdico cualquier pregunta que tenga. Document Revised: 05/28/2020 Document Reviewed: 05/28/2020 Elsevier Patient Education  [place].

## 2022-01-23 ENCOUNTER — Encounter: Payer: Self-pay | Admitting: Nurse Practitioner

## 2022-01-23 ENCOUNTER — Other Ambulatory Visit (HOSPITAL_COMMUNITY)
Admission: RE | Admit: 2022-01-23 | Discharge: 2022-01-23 | Disposition: A | Discharge status: Home / Self Care | Payer: BC Managed Care – PPO | Source: Ambulatory Visit | Attending: Nurse Practitioner | Admitting: Nurse Practitioner

## 2022-01-23 ENCOUNTER — Ambulatory Visit (INDEPENDENT_AMBULATORY_CARE_PROVIDER_SITE_OTHER): Payer: BC Managed Care – PPO | Admitting: Nurse Practitioner

## 2022-01-23 VITALS — BP 120/72 | HR 52 | Temp 98.3°F | Ht 62.9 in | Wt 164.8 lb

## 2022-01-23 DIAGNOSIS — Z124 Encounter for screening for malignant neoplasm of cervix: Secondary | ICD-10-CM

## 2022-01-23 DIAGNOSIS — E559 Vitamin D deficiency, unspecified: Secondary | ICD-10-CM | POA: Diagnosis not present

## 2022-01-23 DIAGNOSIS — Z1159 Encounter for screening for other viral diseases: Secondary | ICD-10-CM

## 2022-01-23 DIAGNOSIS — E7800 Pure hypercholesterolemia, unspecified: Secondary | ICD-10-CM

## 2022-01-23 DIAGNOSIS — Z6829 Body mass index (BMI) 29.0-29.9, adult: Secondary | ICD-10-CM | POA: Diagnosis not present

## 2022-01-23 DIAGNOSIS — Z114 Encounter for screening for human immunodeficiency virus [HIV]: Secondary | ICD-10-CM

## 2022-01-23 DIAGNOSIS — Z0000 Encounter for general adult medical examination without abnormal findings: Secondary | ICD-10-CM

## 2022-01-23 DIAGNOSIS — R7301 Impaired fasting glucose: Secondary | ICD-10-CM | POA: Diagnosis not present

## 2022-01-23 DIAGNOSIS — Z23 Encounter for immunization: Secondary | ICD-10-CM | POA: Diagnosis not present

## 2022-01-23 NOTE — Assessment & Plan Note (Signed)
Noted past labs, recheck today and start medications as needed in future. The 10-year ASCVD risk score (Arnett DK, et al., 2019) is: 2.7%   Values used to calculate the score:     Age: 56 years     Sex: Female     Is Non-Hispanic African American: No     Diabetic: No     Tobacco smoker: No     Systolic Blood Pressure: 737 mmHg     Is BP treated: No     HDL Cholesterol: 59 mg/dL     Total Cholesterol: 272 mg/dL

## 2022-01-23 NOTE — Assessment & Plan Note (Signed)
BMI 29.29. Recommended eating smaller high protein, low fat meals more frequently and exercising 30 mins a day 5 times a week with a goal of 10-15lb weight loss in the next 3 months. Patient voiced their understanding and motivation to adhere to these recommendations.

## 2022-01-23 NOTE — Progress Notes (Signed)
BP 120/72   Pulse (!) 52   Temp 98.3 F (36.8 C) (Oral)   Ht 5' 2.9" (1.598 m)   Wt 164 lb 12.8 oz (74.8 kg)   LMP 08/08/2020 (Approximate)   SpO2 97%   BMI 29.29 kg/m    Subjective:    Patient ID: Leslie Spencer, female    DOB: 05/08/1965, 56 y.o.   MRN: [id number]  Interpreter at bedside to assist with visit.  HPI: Leslie Spencer is a 56 y.o. female presenting on 01/23/2022 for comprehensive medical examination. Current medical complaints include:none  She currently lives with: husband Menopausal Symptoms: no     01/23/2022    8:47 AM 01/20/2021   10:26 AM  Depression screen PHQ 2/9  Decreased Interest 0 0  Down, Depressed, Hopeless 0 0  PHQ - 2 Score 0 0  Altered sleeping 1 1  Tired, decreased energy 0 0  Change in appetite 0 0  Feeling bad or failure about yourself  0 0  Trouble concentrating 0 0  Moving slowly or fidgety/restless 0 0  Suicidal thoughts 0 0  PHQ-9 Score 1 1  Difficult doing work/chores Not difficult at all Not difficult at all     The patient does not have a history of falls. I did not complete a risk assessment for falls. A plan of care for falls was not documented.  Functional Status Survey: Is the patient deaf or have difficulty hearing?: No Does the patient have difficulty seeing, even when wearing glasses/contacts?: No Does the patient have difficulty concentrating, remembering, or making decisions?: No Does the patient have difficulty walking or climbing stairs?: No Does the patient have difficulty dressing or bathing?: No Does the patient have difficulty doing errands alone such as visiting a doctor's office or shopping?: No    Past Medical History:  Past Medical History:  Diagnosis Date   Medical history non-contributory     Surgical History:  Past Surgical History:  Procedure Laterality Date   CESAREAN SECTION     COLONOSCOPY WITH PROPOFOL N/A 01/27/2021   Procedure: COLONOSCOPY WITH PROPOFOL;  Surgeon: Lucilla Lame,  MD;  Location: [facility];  Service: Endoscopy;  Laterality: N/A;   POLYPECTOMY  01/27/2021   Procedure: POLYPECTOMY;  Surgeon: Lucilla Lame, MD;  Location: [facility];  Service: Endoscopy;;    Medications:  No current outpatient medications on file prior to visit.   No current facility-administered medications on file prior to visit.    Allergies:  No Known Allergies  Social History:  Social History   Socioeconomic History   Marital status: Married    Spouse name: Not on file   Number of children: Not on file   Years of education: Not on file   Highest education level: Not on file  Occupational History   Not on file  Tobacco Use   Smoking status: Never   Smokeless tobacco: Never  Vaping Use   Vaping Use: Never used  Substance and Sexual Activity   Alcohol use: No   Drug use: No   Sexual activity: Yes  Other Topics Concern   Not on file  Social History Narrative   Not on file   Social Determinants of Health   Financial Resource Strain: Low Risk  (01/20/2021)   Overall Financial Resource Strain (CARDIA)    Difficulty of Paying Living Expenses: Not hard at all  Food Insecurity: No Food Insecurity (01/20/2021)   Hunger Vital Sign    Worried About Running Out of Food  in the Last Year: Never true    [place] in the Last Year: Never true  Transportation Needs: No Transportation Needs (01/20/2021)   PRAPARE - Hydrologist (Medical): No    Lack of Transportation (Non-Medical): No  Physical Activity: Sufficiently Active (01/20/2021)   Exercise Vital Sign    Days of Exercise per Week: 5 days    Minutes of Exercise per Session: 30 min  Stress: No Stress Concern Present (01/20/2021)   [place]    Feeling of Stress : Not at all  Social Connections: Moderately Isolated (01/20/2021)   Social Connection and Isolation Panel [NHANES]    Frequency of  Communication with Friends and Family: More than three times a week    Frequency of Social Gatherings with Friends and Family: More than three times a week    Attends Religious Services: Never    Marine scientist or Organizations: No    Attends Archivist Meetings: Never    Marital Status: Married  Human resources officer Violence: Not At Risk (01/20/2021)   Humiliation, Afraid, Rape, and Kick questionnaire    Fear of Current or Ex-Partner: No    Emotionally Abused: No    Physically Abused: No    Sexually Abused: No   Social History   Tobacco Use  Smoking Status Never  Smokeless Tobacco Never   Social History   Substance and Sexual Activity  Alcohol Use No    Family History:  Family History  Problem Relation Age of Onset   Heart disease Father    Breast cancer Neg Hx     Past medical history, surgical history, medications, allergies, family history and social history reviewed with patient today and changes made to appropriate areas of the chart.   ROS All other ROS negative except what is listed above and in the HPI.      Objective:    BP 120/72   Pulse (!) 52   Temp 98.3 F (36.8 C) (Oral)   Ht 5' 2.9" (1.598 m)   Wt 164 lb 12.8 oz (74.8 kg)   LMP 08/08/2020 (Approximate)   SpO2 97%   BMI 29.29 kg/m   Wt Readings from Last 3 Encounters:  01/23/22 164 lb 12.8 oz (74.8 kg)  01/27/21 160 lb (72.6 kg)  01/20/21 164 lb (74.4 kg)    Physical Exam Vitals and nursing note reviewed. Exam conducted with a chaperone present.  Constitutional:      General: She is awake. She is not in acute distress.    Appearance: She is well-developed and well-groomed. She is obese. She is not ill-appearing or toxic-appearing.  HENT:     Head: Normocephalic and atraumatic.     Right Ear: Hearing, tympanic membrane, ear canal and external ear normal. No drainage.     Left Ear: Hearing, tympanic membrane, ear canal and external ear normal. No drainage.     Nose: Nose  normal.     Right Sinus: No maxillary sinus tenderness or frontal sinus tenderness.     Left Sinus: No maxillary sinus tenderness or frontal sinus tenderness.     Mouth/Throat:     Mouth: Mucous membranes are moist.     Pharynx: Oropharynx is clear. Uvula midline. No pharyngeal swelling, oropharyngeal exudate or posterior oropharyngeal erythema.  Eyes:     General: Lids are normal.        Right eye: No discharge.  Left eye: No discharge.     Extraocular Movements: Extraocular movements intact.     Conjunctiva/sclera: Conjunctivae normal.     Pupils: Pupils are equal, round, and reactive to light.     Visual Fields: Right eye visual fields normal and left eye visual fields normal.  Neck:     Thyroid: No thyromegaly.     Vascular: No carotid bruit.     Trachea: Trachea normal.  Cardiovascular:     Rate and Rhythm: Normal rate and regular rhythm.     Heart sounds: Normal heart sounds. No murmur heard.    No gallop.  Pulmonary:     Effort: Pulmonary effort is normal. No accessory muscle usage or respiratory distress.     Breath sounds: Normal breath sounds.  Chest:  Breasts:    Right: Normal.     Left: Normal.  Abdominal:     General: Bowel sounds are normal.     Palpations: Abdomen is soft. There is no hepatomegaly or splenomegaly.     Tenderness: There is no abdominal tenderness.     Hernia: There is no hernia in the left inguinal area or right inguinal area.  Genitourinary:    Exam position: Lithotomy position.     Labia:        Right: No rash.        Left: No rash.      Vagina: Erythema present.     Cervix: Normal.     Uterus: Normal.      Adnexa: Right adnexa normal and left adnexa normal.     Rectum: Normal.     Comments: Mild vaginal atrophy noted to vaginal walls.  Pap obtained and sent to lab. Cervix anterior and viewed. Musculoskeletal:        General: Normal range of motion.     Cervical back: Normal range of motion and neck supple.     Right lower leg: No  edema.     Left lower leg: No edema.  Lymphadenopathy:     Head:     Right side of head: No submental, submandibular, tonsillar, preauricular or posterior auricular adenopathy.     Left side of head: No submental, submandibular, tonsillar, preauricular or posterior auricular adenopathy.     Cervical: No cervical adenopathy.     Upper Body:     Right upper body: No supraclavicular, axillary or pectoral adenopathy.     Left upper body: No supraclavicular, axillary or pectoral adenopathy.  Skin:    General: Skin is warm and dry.     Capillary Refill: Capillary refill takes less than 2 seconds.     Findings: No rash.  Neurological:     Mental Status: She is alert and oriented to person, place, and time.     Gait: Gait is intact.     Deep Tendon Reflexes: Reflexes are normal and symmetric.     Reflex Scores:      Brachioradialis reflexes are 2+ on the right side and 2+ on the left side.      Patellar reflexes are 2+ on the right side and 2+ on the left side. Psychiatric:        Attention and Perception: Attention normal.        Mood and Affect: Mood normal.        Speech: Speech normal.        Behavior: Behavior normal. Behavior is cooperative.        Thought Content: Thought content normal.  Judgment: Judgment normal.     Results for orders placed or performed during the hospital encounter of 01/27/21  Pregnancy, urine POC  Result Value Ref Range   Preg Test, Ur NEGATIVE NEGATIVE  Surgical pathology  Result Value Ref Range   SURGICAL PATHOLOGY      SURGICAL PATHOLOGY CASE: [id number] PATIENT: Lenna Sciara Surgical Pathology Report     Specimen Submitted: A. Colon polyp x2, ascending; cold snare B. Colon polyp, descending; cold snare  Clinical History: Colon cancer screening Z12.11.  Colon polyps      DIAGNOSIS: A. COLON POLYP X2, ASCENDING; COLD SNARE: - TUBULAR ADENOMA (MULTIPLE FRAGMENTS). - NEGATIVE FOR HIGH-GRADE DYSPLASIA AND MALIGNANCY.  B.   COLON POLYP, DESCENDING; COLD SNARE: - TUBULAR ADENOMA. - NEGATIVE FOR HIGH-GRADE DYSPLASIA AND MALIGNANCY.  GROSS DESCRIPTION: A. Labeled: Ascending colon polyp x2 cold snare Received: Formalin Collection time: 10:50 AM on 01/27/2021 Placed into formalin time: 10:50 AM on 01/27/2021 Tissue fragment(s): Multiple Size: Aggregate, 1.3 x 0.5 x 0.1 cm Description: Tan soft tissue fragments Entirely submitted in 1 cassette.  B. Labeled: Descending colon polyp cold snare Received: Formalin Collection time: 10:59 AM on 01/27/2021 Placed into formalin  time: 10:59 AM on 01/27/2021 Tissue fragment(s): Multiple Size: Aggregate, 0.6 x 0.3 x 0.1 cm Description: Tan soft tissue fragments Entirely submitted in 1 cassette.  [facility] 01/27/2021  Final Diagnosis performed by Quay Burow, MD.   Electronically signed 01/28/2021 1:10:21PM The electronic signature indicates that the named Attending Pathologist has evaluated the specimen Technical component performed at [facility], [address] [place], [place] [address] Lab: [phone] Dir: Rush Farmer, MD, MMM  Professional component performed at [facility], [facility], [place], [place], [place] [address] Lab: [phone] Dir: Kathi Simpers, MD       Assessment & Plan:   Problem List Items Addressed This Visit       Endocrine   IFG (impaired fasting glucose)    Noted on past labs, check A1c today.      Relevant Orders   TSH   HgB A1c     Other   BMI 29.0-29.9,adult    BMI 29.29. Recommended eating smaller high protein, low fat meals more frequently and exercising 30 mins a day 5 times a week with a goal of 10-15lb weight loss in the next 3 months. Patient voiced their understanding and motivation to adhere to these recommendations.       Elevated low density lipoprotein (LDL) cholesterol level - Primary    Noted past labs, recheck today and start medications as needed in future. The 10-year ASCVD risk score  (Arnett DK, et al., 2019) is: 2.7%   Values used to calculate the score:     Age: 33 years     Sex: Female     Is Non-Hispanic African American: No     Diabetic: No     Tobacco smoker: No     Systolic Blood Pressure: 027 mmHg     Is BP treated: No     HDL Cholesterol: 59 mg/dL     Total Cholesterol: 272 mg/dL       Relevant Orders   Comprehensive metabolic panel   Lipid Panel w/o Chol/HDL Ratio   Other Visit Diagnoses     Vitamin D deficiency       History of low levels reported, check today and start supplement as needed.   Relevant Orders   VITAMIN D 25 Hydroxy (Vit-D Deficiency, Fractures)   Cervical cancer screening  Pap performed and sent to lab.   Relevant Orders   Cytology - PAP   Need for Td vaccine       Td vaccine today.   Relevant Orders   Td vaccine greater than or equal to 7yo preservative free IM   Encounter for screening for HIV       HIV screen on labs today per guidelines for one time screening, discussed with patient.   Relevant Orders   HIV Antibody (routine testing w rflx)   Need for hepatitis C screening test       Hep C screen on labs today per guidelines for one time screening, discussed with patient.   Relevant Orders   Hepatitis C antibody   Encounter for annual physical exam       Annual physical today with labs and health maintenance reviewed, discussed with patient.   Relevant Orders   CBC with Differential/Platelet        Follow up plan: Return in about 1 year (around 01/24/2023) for Annual physical.   LABORATORY TESTING:  - Pap smear: pap done  IMMUNIZATIONS:   - Tdap: Tetanus vaccination status reviewed: Td vaccination indicated and given today. - Influenza: Up to date - Pneumovax: Not applicable - Prevnar: Not applicable - COVID: Up to date - HPV: Not applicable - Shingrix vaccine: Up to date  SCREENING: -Mammogram: Up to date  - Colonoscopy: Up to date  - Bone Density: Not applicable  -Hearing Test: Not  applicable  -Spirometry: Not applicable   PATIENT COUNSELING:   Advised to take 1 mg of folate supplement per day if capable of pregnancy.   Sexuality: Discussed sexually transmitted diseases, partner selection, use of condoms, avoidance of unintended pregnancy  and contraceptive alternatives.   Advised to avoid cigarette smoking.  I discussed with the patient that most people either abstain from alcohol or drink within safe limits (<=14/week and <=4 drinks/occasion for males, <=7/weeks and <= 3 drinks/occasion for females) and that the risk for alcohol disorders and other health effects rises proportionally with the number of drinks per week and how often a drinker exceeds daily limits.  Discussed cessation/primary prevention of drug use and availability of treatment for abuse.   Diet: Encouraged to adjust caloric intake to maintain  or achieve ideal body weight, to reduce intake of dietary saturated fat and total fat, to limit sodium intake by avoiding high sodium foods and not adding table salt, and to maintain adequate dietary potassium and calcium preferably from fresh fruits, vegetables, and low-fat dairy products.    Stressed the importance of regular exercise  Injury prevention: Discussed safety belts, safety helmets, smoke detector, smoking near bedding or upholstery.   Dental health: Discussed importance of regular tooth brushing, flossing, and dental visits.    NEXT PREVENTATIVE PHYSICAL DUE IN 1 YEAR. Return in about 1 year (around 01/24/2023) for Annual physical.

## 2022-01-23 NOTE — Assessment & Plan Note (Signed)
Noted on past labs, check A1c today.

## 2022-01-24 ENCOUNTER — Encounter: Payer: Self-pay | Admitting: Nurse Practitioner

## 2022-01-24 DIAGNOSIS — E559 Vitamin D deficiency, unspecified: Secondary | ICD-10-CM | POA: Insufficient documentation

## 2022-01-24 LAB — LIPID PANEL W/O CHOL/HDL RATIO
Cholesterol, Total: 244 mg/dL — ABNORMAL HIGH (ref 100–199)
HDL: 49 mg/dL (ref >39)
LDL Chol Calc (NIH): 158 mg/dL — ABNORMAL HIGH (ref 0–99)
Triglycerides: 201 mg/dL — ABNORMAL HIGH (ref 0–149)
VLDL Cholesterol Cal: 37 mg/dL (ref 5–40)

## 2022-01-24 LAB — CBC WITH DIFFERENTIAL/PLATELET
Basophils Absolute: 0.1 10*3/uL (ref 0.0–0.2)
Basos: 1 %
EOS (ABSOLUTE): 0.2 10*3/uL (ref 0.0–0.4)
Eos: 3 %
Hematocrit: 41.2 % (ref 34.0–46.6)
Hemoglobin: 13.9 g/dL (ref 11.1–15.9)
Immature Grans (Abs): 0 10*3/uL (ref 0.0–0.1)
Immature Granulocytes: 0 %
Lymphocytes Absolute: 1.7 10*3/uL (ref 0.7–3.1)
Lymphs: 29 %
MCH: 31 pg (ref 26.6–33.0)
MCHC: 33.7 g/dL (ref 31.5–35.7)
MCV: 92 fL (ref 79–97)
Monocytes Absolute: 0.4 10*3/uL (ref 0.1–0.9)
Monocytes: 8 %
Neutrophils Absolute: 3.4 10*3/uL (ref 1.4–7.0)
Neutrophils: 59 %
Platelets: 295 10*3/uL (ref 150–450)
RBC: 4.49 x10E6/uL (ref 3.77–5.28)
RDW: 12.3 % (ref 11.7–15.4)
WBC: 5.7 10*3/uL (ref 3.4–10.8)

## 2022-01-24 LAB — TSH: TSH: 2.16 u[IU]/mL (ref 0.450–4.500)

## 2022-01-24 LAB — COMPREHENSIVE METABOLIC PANEL
ALT: 14 IU/L (ref 0–32)
AST: 19 IU/L (ref 0–40)
Albumin/Globulin Ratio: 1.7 (ref 1.2–2.2)
Albumin: 4.4 g/dL (ref 3.8–4.9)
Alkaline Phosphatase: 120 IU/L (ref 44–121)
BUN/Creatinine Ratio: 29 — ABNORMAL HIGH (ref 9–23)
BUN: 18 mg/dL (ref 6–24)
Bilirubin Total: 0.3 mg/dL (ref 0.0–1.2)
CO2: 22 mmol/L (ref 20–29)
Calcium: 9.4 mg/dL (ref 8.7–10.2)
Chloride: 104 mmol/L (ref 96–106)
Creatinine, Ser: 0.63 mg/dL (ref 0.57–1.00)
Globulin, Total: 2.6 g/dL (ref 1.5–4.5)
Glucose: 96 mg/dL (ref 70–99)
Potassium: 4.2 mmol/L (ref 3.5–5.2)
Sodium: 142 mmol/L (ref 134–144)
Total Protein: 7 g/dL (ref 6.0–8.5)
eGFR: 103 mL/min/{1.73_m2} (ref >59)

## 2022-01-24 LAB — HEPATITIS C ANTIBODY: Hep C Virus Ab: NONREACTIVE

## 2022-01-24 LAB — HEMOGLOBIN A1C
Est. average glucose Bld gHb Est-mCnc: 114 mg/dL
Hgb A1c MFr Bld: 5.6 % (ref 4.8–5.6)

## 2022-01-24 LAB — HIV ANTIBODY (ROUTINE TESTING W REFLEX): HIV Screen 4th Generation wRfx: NONREACTIVE

## 2022-01-24 LAB — VITAMIN D 25 HYDROXY (VIT D DEFICIENCY, FRACTURES): Vit D, 25-Hydroxy: 26.5 ng/mL — ABNORMAL LOW (ref 30.0–100.0)

## 2022-01-24 NOTE — Progress Notes (Signed)
Contacted via [place]  -- however please call to ensure she received below message: Good afternoon Leslie Spencer, overall labs are stable with a couple exceptions: - Cholesterol levels remain a little elevated but can continue to focus on diet and exercise at home.  No medications at this time.  Eat more fish and chicken, less red meat + recommend to eat out less and less processed foods. - Vitamin D level a little low, this helps support bone health.  Recommend she start taking Vitamin D3 2000 units daily for overall bone health.  We will recheck next visit.  Any questions?   Keep being amazing!!  Thank you for allowing me to participate in your care.  I appreciate you. Kindest regards, Kadra Kohan

## 2022-01-25 ENCOUNTER — Telehealth: Payer: Self-pay | Admitting: Nurse Practitioner

## 2022-01-25 LAB — CYTOLOGY - PAP
Comment: NEGATIVE
Diagnosis: NEGATIVE
High risk HPV: NEGATIVE

## 2022-01-25 NOTE — Telephone Encounter (Signed)
Noted  

## 2022-01-25 NOTE — Progress Notes (Signed)
Good afternoon, please have crew alert Leslie Spencer that her pap returned negative!!  Great news!!  Do not need to repeat for 5 years:)

## 2022-01-25 NOTE — Telephone Encounter (Signed)
Pt called in re lab results with assistance of her son. I offered to get an interpreter and get to NT. She said she saw the message on MyChart about the diet and Vitamin D. They declined talking with NT and stated that it was no need as she already got the info and the diet info.

## 2022-04-18 ENCOUNTER — Other Ambulatory Visit: Payer: Self-pay | Admitting: Nurse Practitioner

## 2022-04-18 DIAGNOSIS — Z1231 Encounter for screening mammogram for malignant neoplasm of breast: Secondary | ICD-10-CM

## 2022-05-04 ENCOUNTER — Other Ambulatory Visit: Payer: Self-pay | Admitting: Nurse Practitioner

## 2022-05-04 NOTE — Telephone Encounter (Signed)
Unable to refill per protocol, Rx expired. Discontinued 01/23/22.  Requested Prescriptions  Pending Prescriptions Disp Refills   loratadine (CLARITIN) 10 MG tablet [Pharmacy Med Name: LORATADINE 10 MG TABLET] 90 tablet 4    Sig: TAKE 1 TABLET BY MOUTH EVERY DAY     Ear, Nose, and Throat:  Antihistamines 2 Passed - 05/04/2022 11:02 AM      Passed - Cr in normal range and within 360 days    Creatinine, Ser  Date Value Ref Range Status  01/23/2022 0.63 0.57 - 1.00 mg/dL Final         Passed - Valid encounter within last 12 months    Recent Outpatient Visits           3 months ago Elevated low density lipoprotein (LDL) cholesterol level   [facility], Henrine Screws T, NP   1 year ago Encounter to establish care   [place] [facility], Barbaraann Faster, NP       Future Appointments             In 8 months [place], Barbaraann Faster, NP [place]

## 2022-05-05 ENCOUNTER — Other Ambulatory Visit: Payer: Self-pay | Admitting: Nurse Practitioner

## 2022-05-05 ENCOUNTER — Telehealth: Payer: Self-pay | Admitting: Nurse Practitioner

## 2022-05-05 MED ORDER — LORATADINE 10 MG PO TABS: 10.0000 mg | ORAL_TABLET | Freq: Every day | ORAL | 4 refills | Status: DC

## 2022-05-05 NOTE — Telephone Encounter (Signed)
Copied from [place]. Topic: General - Other >> May 05, 2022 11:03 AM Everette C wrote: Reason for CRM: Medication Refill - Medication: loratadine (CLARITIN) 10 MG tablet [id number]  Has the patient contacted their pharmacy? Yes.   (Agent: If no, request that the patient contact the pharmacy for the refill. If patient does not wish to contact the pharmacy document the reason why and proceed with request.) (Agent: If yes, when and what did the pharmacy advise?)  Preferred Pharmacy (with phone number or street name): [id number] [id number]: [phone]: [phone]: Not open 24 hours   Has the patient been seen for an appointment in the last year OR does the patient have an upcoming appointment? Yes.    Agent: Please be advised that RX refills may take up to 3 business days. We ask that you follow-up with your pharmacy.

## 2022-05-05 NOTE — Telephone Encounter (Signed)
Requested medications are due for refill today.  no  Requested medications are on the active medications list.  no  Last refill. 02/25/2021   Future visit scheduled.   yes  Notes to clinic.  Medication not on med list. Med discontinued 01/23/2022.    Requested Prescriptions  Pending Prescriptions Disp Refills   loratadine (CLARITIN) 10 MG tablet [Pharmacy Med Name: LORATADINE 10 MG TABLET] 90 tablet 4    Sig: TAKE 1 TABLET BY MOUTH EVERY DAY     Ear, Nose, and Throat:  Antihistamines 2 Passed - 05/05/2022 10:55 AM      Passed - Cr in normal range and within 360 days    Creatinine, Ser  Date Value Ref Range Status  01/23/2022 0.63 0.57 - 1.00 mg/dL Final         Passed - Valid encounter within last 12 months    Recent Outpatient Visits           3 months ago Elevated low density lipoprotein (LDL) cholesterol level   [place] [facility], Henrine Screws T, NP   1 year ago Encounter to establish care   [place] Coker, Barbaraann Faster, NP       Future Appointments             In 8 months Cannady, Barbaraann Faster, NP [place]

## 2022-05-10 ENCOUNTER — Ambulatory Visit
Admission: RE | Admit: 2022-05-10 | Discharge: 2022-05-10 | Disposition: A | Discharge status: Home / Self Care | Payer: BC Managed Care – PPO | Source: Ambulatory Visit | Attending: Nurse Practitioner | Admitting: Nurse Practitioner

## 2022-05-10 DIAGNOSIS — Z1231 Encounter for screening mammogram for malignant neoplasm of breast: Secondary | ICD-10-CM | POA: Insufficient documentation

## 2022-05-11 NOTE — Progress Notes (Signed)
Contacted via MyChart   Normal mammogram, may repeat in one year:)

## 2022-10-23 DIAGNOSIS — Z85828 Personal history of other malignant neoplasm of skin: Secondary | ICD-10-CM | POA: Diagnosis not present

## 2022-10-23 DIAGNOSIS — L578 Other skin changes due to chronic exposure to nonionizing radiation: Secondary | ICD-10-CM | POA: Diagnosis not present

## 2023-01-21 NOTE — Patient Instructions (Signed)
Alimentacin saludable en los adultos Healthy Eating, Adult Una alimentacin saludable puede ayudarlo a alcanzar y mantener un peso saludable, reducir el riesgo de tener enfermedades crnicas y vivir una vida larga y productiva. Es importante que siga una modalidad de alimentacin saludable. Sus necesidades nutricionales y calricas deben satisfacerse principalmente con distintos alimentos ricos en nutrientes. Consejos para seguir este plan Lea las etiquetas de los alimentos Lea las etiquetas y elija las que digan lo siguiente: Productos reducidos en sodio o con bajo contenido de sodio. Jugos con 100 % jugo de fruta. Alimentos con bajo contenido de grasas saturadas (menos de 3 g por porcin) y alto contenido de grasas poliinsaturadas y monoinsaturadas. Alimentos con cereales integrales, como trigo integral, trigo partido, arroz integral y arroz salvaje. Cereales integrales fortificados con cido flico. Esto se recomienda a las mujeres embarazadas o que desean quedar embarazadas. Lea las etiquetas y no coma ni beba lo siguiente: Alimentos o bebidas con azcar agregada. Estos incluyen los alimentos que contienen azcar moreno, endulzante a base de maz, jarabe de maz, dextrosa, fructosa, glucosa, jarabe de maz de alta fructosa, miel, azcar invertido, lactosa, jarabe de malta, maltosa, melaza, azcar sin refinar, sacarosa, trehalosa y azcar turbinado. Limite el consumo de azcar agregada a menos del 10 % del total de caloras diarias. No consuma ms que las siguientes cantidades de azcar agregada por da: 6 cucharaditas (25 g) para las mujeres. 9 cucharaditas (38 g) para los hombres. Los alimentos que contienen almidones y cereales refinados o procesados. Los productos de cereales refinados, como harina blanca, harina de maz desgerminada, pan blanco y arroz blanco. Al ir de compras Elija refrigerios ricos en nutrientes, como verduras, frutas enteras y frutos secos. Evite los refrigerios con  alto contenido de caloras y azcar, como las papas fritas, los refrigerios frutales y los caramelos. Use alios y productos para untar a base de aceite con los alimentos en lugar de grasas slidas como la mantequilla, la margarina, la crema agria o el queso crema. Limite las salsas, las mezclas y los productos "instantneos" preelaborados como el arroz saborizado, los fideos instantneos y las pastas listas para comer. Pruebe ms fuentes de protena vegetal, como tofu, tempeh, frijoles negros, edamame, lentejas, frutos secos y semillas. Explore planes de alimentacin como la dieta mediterrnea o la dieta vegetariana. Pruebe salsas cardiosaludables hechas con frijoles y grasas saludables, como hummus y guacamole. Las verduras van muy bien con ellas. Al cocinar Use aceite para saltear los alimentos en lugar de grasas slidas como mantequilla, margarina o manteca de cerdo. En lugar de frer, trate de cocinar en el horno, en la plancha o en la parrilla, o hervir los alimentos. Retire la parte grasa de las carnes antes de cocinarlas. Cocine las verduras al vapor en agua o caldo. Planificacin de las comidas  En las comidas, imagine dividir su plato en cuartos: La mitad del plato tiene frutas y verduras. Un cuarto del plato tiene cereales integrales. Un cuarto del plato tiene protena, especialmente carnes magras, aves, huevos, tofu, frijoles o frutos secos. Incluya lcteos descremados en su dieta diaria. Estilo de vida Elija opciones saludables en todos los mbitos, como en el hogar, el trabajo, la escuela, los restaurantes y las tiendas. Prepare los alimentos de un modo seguro: Lvese las manos despus de manipular carnes crudas. Donde prepare alimentos, mantenga las superficies limpias lavndolas regularmente con agua caliente y jabn. Mantenga las carnes crudas separadas de los alimentos que estn listos para comer como las frutas y las verduras. Cocine los frutos   de mar, carnes, aves y huevos  hasta alcanzar la temperatura recomendada. Consiga un termmetro para alimentos. Almacene los alimentos a temperaturas seguras. En general: Mantenga los alimentos fros a una temperatura de 40 F (4,4 C) o inferior. Mantenga los alimentos calientes a una temperatura de 140 F (60 C) o superior. Mantenga el congelador a una temperatura de 0 F (-17,8 C) o inferior. Los alimentos no son seguros para su consumo cuando han estado a una temperatura de entre 40 y 140 F (4.4 y 60 C) por ms de 2 horas. Qu alimentos debo comer? Frutas Propngase comer entre 1 y 2 tazas de frutas frescas, enlatadas (en su jugo natural) o congeladas cada da. Una taza de fruta equivale a 1 manzana pequea, 1 banana grande, 8 fresas grandes, 1 taza (237 g) de fruta enlatada,  taza (82 g) de fruta seca o 1 taza (240 ml) de jugo al 100 %. Verduras Propngase comer de 2 a 4 tazas de verduras frescas y congeladas cada da, incluyendo diferentes variedades y colores. Una taza de verduras equivale a 1 taza (91 g) de brcoli o coliflor, 2 zanahorias medianas, 2 tazas (150 g) de verduras de hoja verde crudas, 1 tomate grande, 1 pimiento morrn grande, 1 batata grande o 1 patata blanca mediana. Cereales Propngase comer el equivalente a entre 4 y 10 onzas de cereales integrales por da. Algunos ejemplos de equivalentes a 1 onza de cereales son 1 rebanada de pan, 1 taza (40 g) de cereal listo para comer, 3 tazas (24 g) de palomitas de maz o  taza (93 g) de arroz cocido. Carnes y otras protenas Propngase comer el equivalente a entre 5 y 7  onzas de protena por da. Algunos ejemplos de equivalentes a 1 onza de protenas incluyen 1 huevo,  oz de frutos secos (12 almendras, 24 pistachos o 7 mitades de nueces), 1/4 taza (90 g) de frijoles cocidos, 6 cucharadas (90 g) de hummus o 1 cucharada (16 g) de mantequilla de man. Un corte de carne o pescado del tamao de un mazo de cartas equivale aproximadamente a 3 a 4 onzas (85  g). De las protenas que consume cada semana, intente que al menos 8 onzas (227 g) sean frutos de mar. Esto equivale a unas 2 porciones por semana. Esto incluye salmn, trucha, arenque y anchoas. Lcteos Propngase comer el equivalente a 3 tazas de lcteos descremados o con bajo contenido de grasa cada da. Algunos ejemplos de equivalentes a 1 taza de lcteos son 1 taza (240 ml) de leche, 8 onzas (250 g) de yogur, 1 onzas (44 g) de queso natural o 1 taza (240 ml) de leche de soja fortificada. Grasas y aceites Propngase consumir alrededor de 5 cucharaditas (21 g) de grasas y aceites por da. Elija grasas monoinsaturadas, como el aceite de canola y de oliva, la mayonesa hecha con aceite de oliva o de aguacate, aguacates, mantequilla de man y la mayora de los frutos secos, o bien grasas poliinsaturadas, como el aceite de girasol, maz y soja, nueces, piones, semillas de ssamo, semillas de girasol y semillas de lino. Bebidas Propngase beber 6 vasos de 8 onzas de agua por da. Limite el caf a entre 3 y 5 tazas de ocho onzas por da. Limite el consumo de bebidas con cafena que tengan caloras agregadas, como los refrescos y las bebidas energizantes. Si bebe alcohol: Limite la cantidad que bebe a lo siguiente: De 0 a 1 medida al da si es mujer. De 0 a 2 medidas   al da si es varn. Sepa cunta cantidad de alcohol hay en las bebidas que toma. En los [place], una medida es una botella de cerveza de 12 oz (355 ml), un vaso de vino de 5 oz (148 ml) o un vaso de una bebida alcohlica de alta graduacin de 1 oz (44 ml). Condimentos y otros alimentos Trate de no agregar demasiada sal a los alimentos. Trate de usar hierbas y especias en lugar de sal. Trate de no agregar azcar a los alimentos. Esta informacin se basa en las pautas de nutricin de los [place] Para obtener ms informacin, visite choosemyplate.gov. Las cantidades exactas pueden variar. Es posible que necesite cantidades  diferentes. Esta informacin no tiene como fin reemplazar el consejo del mdico. Asegrese de hacerle al mdico cualquier pregunta que tenga. Document Revised: 01/10/2022 Document Reviewed: 01/10/2022 Elsevier Patient Education  2024 [employer]  

## 2023-01-25 ENCOUNTER — Ambulatory Visit (INDEPENDENT_AMBULATORY_CARE_PROVIDER_SITE_OTHER): Payer: BC Managed Care – PPO | Admitting: Nurse Practitioner

## 2023-01-25 ENCOUNTER — Encounter: Payer: Self-pay | Admitting: Nurse Practitioner

## 2023-01-25 VITALS — BP 127/79 | HR 61 | Temp 98.1°F | Ht 62.75 in | Wt 162.8 lb

## 2023-01-25 DIAGNOSIS — E7800 Pure hypercholesterolemia, unspecified: Secondary | ICD-10-CM | POA: Diagnosis not present

## 2023-01-25 DIAGNOSIS — Z0000 Encounter for general adult medical examination without abnormal findings: Secondary | ICD-10-CM | POA: Diagnosis not present

## 2023-01-25 DIAGNOSIS — E559 Vitamin D deficiency, unspecified: Secondary | ICD-10-CM

## 2023-01-25 DIAGNOSIS — R7301 Impaired fasting glucose: Secondary | ICD-10-CM

## 2023-01-25 DIAGNOSIS — Z6829 Body mass index (BMI) 29.0-29.9, adult: Secondary | ICD-10-CM

## 2023-01-25 NOTE — Assessment & Plan Note (Signed)
Noted on past labs, continue supplements and check labs today.

## 2023-01-25 NOTE — Assessment & Plan Note (Signed)
Noted on past labs, check A1c today. 

## 2023-01-25 NOTE — Assessment & Plan Note (Signed)
BMI 29.07. Praised for her exercising 5 days a week, she has started running.  Recommended eating smaller high protein, low fat meals more frequently and exercising 30 mins a day 5 times a week with a goal of 10-15lb weight loss in the next 3 months. Patient voiced their understanding and motivation to adhere to these recommendations.

## 2023-01-25 NOTE — Assessment & Plan Note (Signed)
Noted past labs, recheck today and start medications as needed in future. The 10-year ASCVD risk score (Arnett DK, et al., 2019) is: 3.4%   Values used to calculate the score:     Age: 57 years     Sex: Female     Is Non-Hispanic African American: No     Diabetic: No     Tobacco smoker: No     Systolic Blood Pressure: 127 mmHg     Is BP treated: No     HDL Cholesterol: 49 mg/dL     Total Cholesterol: 244 mg/dL

## 2023-01-25 NOTE — Progress Notes (Signed)
BP 127/79 (BP Location: Right Arm, Patient Position: Sitting, Cuff Size: Normal)   Pulse 61   Temp 98.1 F (36.7 C) (Oral)   Ht 5' 2.75" (1.594 m)   Wt 162 lb 12.8 oz (73.8 kg)   LMP 08/08/2020 (Approximate)   SpO2 98%   BMI 29.07 kg/m    Subjective:    Patient ID: Leslie Spencer, female    DOB: [birth year], 57 y.o.   MRN: [id number]  Interpreter at bedside to assist with visit.  HPI: Leslie Spencer is a 57 y.o. female presenting on 01/25/2023 for comprehensive medical examination. Current medical complaints include:none  She currently lives with: husband Menopausal Symptoms: no  The 10-year ASCVD risk score (Arnett DK, et al., 2019) is: 3.4%   Values used to calculate the score:     Age: 60 years     Sex: Female     Is Non-Hispanic African American: No     Diabetic: No     Tobacco smoker: No     Systolic Blood Pressure: 127 mmHg     Is BP treated: No     HDL Cholesterol: 49 mg/dL     Total Cholesterol: 244 mg/dL     78/29/5621    3:08 AM 01/23/2022    8:47 AM 01/20/2021   10:26 AM  Depression screen PHQ 2/9  Decreased Interest 0 0 0  Down, Depressed, Hopeless 0 0 0  PHQ - 2 Score 0 0 0  Altered sleeping 0 1 1  Tired, decreased energy 0 0 0  Change in appetite 0 0 0  Feeling bad or failure about yourself  0 0 0  Trouble concentrating 0 0 0  Moving slowly or fidgety/restless 0 0 0  Suicidal thoughts 0 0 0  PHQ-9 Score 0 1 1  Difficult doing work/chores  Not difficult at all Not difficult at all       01/25/2023    8:43 AM 01/23/2022    8:47 AM 01/20/2021   10:26 AM  GAD 7 : Generalized Anxiety Score  Nervous, Anxious, on Edge 0 0 0  Control/stop worrying 0 0 0  Worry too much - different things 0 0 0  Trouble relaxing 0 0 0  Restless 0 0 0  Easily annoyed or irritable 0 0 0  Afraid - awful might happen 0 0 0  Total GAD 7 Score 0 0 0  Anxiety Difficulty  Not difficult at all Not difficult at all      01/20/2021   10:20 AM 01/27/2021   10:10  AM 01/23/2022    8:47 AM  Fall Risk  Falls in the past year? 0  0  Was there an injury with Fall? 0  0  Fall Risk Category Calculator 0  0  Fall Risk Category (Retired) Low  Low  (RETIRED) Patient Fall Risk Level Low fall risk Low fall risk Low fall risk  Patient at Risk for Falls Due to No Fall Risks  No Fall Risks  Fall risk Follow up Falls evaluation completed  Falls evaluation completed    Functional Status Survey: Is the patient deaf or have difficulty hearing?: No Does the patient have difficulty seeing, even when wearing glasses/contacts?: No Does the patient have difficulty concentrating, remembering, or making decisions?: No Does the patient have difficulty walking or climbing stairs?: No Does the patient have difficulty dressing or bathing?: No Does the patient have difficulty doing errands alone such as visiting a doctor's office or shopping?: No  Past Medical History:  Past Medical History:  Diagnosis Date   Medical history non-contributory     Surgical History:  Past Surgical History:  Procedure Laterality Date   CESAREAN SECTION     COLONOSCOPY WITH PROPOFOL N/A 01/27/2021   Procedure: COLONOSCOPY WITH PROPOFOL;  Surgeon: Midge Minium, MD;  Location: [facility];  Service: Endoscopy;  Laterality: N/A;   POLYPECTOMY  01/27/2021   Procedure: POLYPECTOMY;  Surgeon: Midge Minium, MD;  Location: [facility];  Service: Endoscopy;;    Medications:  Current Outpatient Medications on File Prior to Visit  Medication Sig   loratadine (CLARITIN) 10 MG tablet Take 1 tablet (10 mg total) by mouth daily.   No current facility-administered medications on file prior to visit.    Allergies:  No Known Allergies  Social History:  Social History   Socioeconomic History   Marital status: Married    Spouse name: Not on file   Number of children: Not on file   Years of education: Not on file   Highest education level: Not on file  Occupational History    Not on file  Tobacco Use   Smoking status: Never   Smokeless tobacco: Never  Vaping Use   Vaping status: Never Used  Substance and Sexual Activity   Alcohol use: No   Drug use: No   Sexual activity: Yes  Other Topics Concern   Not on file  Social History Narrative   Not on file   Social Determinants of Health   Financial Resource Strain: Low Risk  (01/20/2021)   Overall Financial Resource Strain (CARDIA)    Difficulty of Paying Living Expenses: Not hard at all  Food Insecurity: No Food Insecurity (01/20/2021)   Hunger Vital Sign    Worried About Running Out of Food in the Last Year: Never true    Ran Out of Food in the Last Year: Never true  Transportation Needs: No Transportation Needs (01/20/2021)   PRAPARE - Administrator, Civil Service (Medical): No    Lack of Transportation (Non-Medical): No  Physical Activity: Sufficiently Active (01/20/2021)   Exercise Vital Sign    Days of Exercise per Week: 5 days    Minutes of Exercise per Session: 30 min  Stress: No Stress Concern Present (01/20/2021)   [employer] of Occupational Health - Occupational Stress Questionnaire    Feeling of Stress : Not at all  Social Connections: Moderately Isolated (01/20/2021)   Social Connection and Isolation Panel [NHANES]    Frequency of Communication with Friends and Family: More than three times a week    Frequency of Social Gatherings with Friends and Family: More than three times a week    Attends Religious Services: Never    Database administrator or Organizations: No    Attends Banker Meetings: Never    Marital Status: Married  Catering manager Violence: Not At Risk (01/20/2021)   Humiliation, Afraid, Rape, and Kick questionnaire    Fear of Current or Ex-Partner: No    Emotionally Abused: No    Physically Abused: No    Sexually Abused: No   Social History   Tobacco Use  Smoking Status Never  Smokeless Tobacco Never   Social History    Substance and Sexual Activity  Alcohol Use No    Family History:  Family History  Problem Relation Age of Onset   Heart disease Father    Breast cancer Neg Hx     Past medical history,  surgical history, medications, allergies, family history and social history reviewed with patient today and changes made to appropriate areas of the chart.   ROS All other ROS negative except what is listed above and in the HPI.      Objective:    BP 127/79 (BP Location: Right Arm, Patient Position: Sitting, Cuff Size: Normal)   Pulse 61   Temp 98.1 F (36.7 C) (Oral)   Ht 5' 2.75" (1.594 m)   Wt 162 lb 12.8 oz (73.8 kg)   LMP 08/08/2020 (Approximate)   SpO2 98%   BMI 29.07 kg/m   Wt Readings from Last 3 Encounters:  01/25/23 162 lb 12.8 oz (73.8 kg)  01/23/22 164 lb 12.8 oz (74.8 kg)  01/27/21 160 lb (72.6 kg)    Physical Exam Vitals and nursing note reviewed. Exam conducted with a chaperone present.  Constitutional:      General: She is awake. She is not in acute distress.    Appearance: She is well-developed and well-groomed. She is not ill-appearing or toxic-appearing.  HENT:     Head: Normocephalic and atraumatic.     Right Ear: Hearing, tympanic membrane, ear canal and external ear normal. No drainage.     Left Ear: Hearing, tympanic membrane, ear canal and external ear normal. No drainage.     Nose: Nose normal.     Right Sinus: No maxillary sinus tenderness or frontal sinus tenderness.     Left Sinus: No maxillary sinus tenderness or frontal sinus tenderness.     Mouth/Throat:     Mouth: Mucous membranes are moist.     Pharynx: Oropharynx is clear. Uvula midline. No pharyngeal swelling, oropharyngeal exudate or posterior oropharyngeal erythema.  Eyes:     General: Lids are normal.        Right eye: No discharge.        Left eye: No discharge.     Extraocular Movements: Extraocular movements intact.     Conjunctiva/sclera: Conjunctivae normal.     Pupils: Pupils are  equal, round, and reactive to light.     Visual Fields: Right eye visual fields normal and left eye visual fields normal.  Neck:     Thyroid: No thyromegaly.     Vascular: No carotid bruit.     Trachea: Trachea normal.  Cardiovascular:     Rate and Rhythm: Normal rate and regular rhythm.     Heart sounds: Normal heart sounds. No murmur heard.    No gallop.  Pulmonary:     Effort: Pulmonary effort is normal. No accessory muscle usage or respiratory distress.     Breath sounds: Normal breath sounds.  Chest:  Breasts:    Right: Normal.     Left: Normal.  Abdominal:     General: Bowel sounds are normal.     Palpations: Abdomen is soft. There is no hepatomegaly or splenomegaly.     Tenderness: There is no abdominal tenderness.  Musculoskeletal:        General: Normal range of motion.     Cervical back: Normal range of motion and neck supple.     Right lower leg: No edema.     Left lower leg: No edema.  Lymphadenopathy:     Head:     Right side of head: No submental, submandibular, tonsillar, preauricular or posterior auricular adenopathy.     Left side of head: No submental, submandibular, tonsillar, preauricular or posterior auricular adenopathy.     Cervical: No cervical adenopathy.  Upper Body:     Right upper body: No supraclavicular, axillary or pectoral adenopathy.     Left upper body: No supraclavicular, axillary or pectoral adenopathy.  Skin:    General: Skin is warm and dry.     Capillary Refill: Capillary refill takes less than 2 seconds.     Findings: No rash.  Neurological:     Mental Status: She is alert and oriented to person, place, and time.     Gait: Gait is intact.     Deep Tendon Reflexes: Reflexes are normal and symmetric.     Reflex Scores:      Brachioradialis reflexes are 2+ on the right side and 2+ on the left side.      Patellar reflexes are 2+ on the right side and 2+ on the left side. Psychiatric:        Attention and Perception: Attention  normal.        Mood and Affect: Mood normal.        Speech: Speech normal.        Behavior: Behavior normal. Behavior is cooperative.        Thought Content: Thought content normal.        Judgment: Judgment normal.    Results for orders placed or performed in visit on 01/23/22  CBC with Differential/Platelet  Result Value Ref Range   WBC 5.7 3.4 - 10.8 x10E3/uL   RBC 4.49 3.77 - 5.28 x10E6/uL   Hemoglobin 13.9 11.1 - 15.9 g/dL   Hematocrit 16.1 09.6 - 46.6 %   MCV 92 79 - 97 fL   MCH 31.0 26.6 - 33.0 pg   MCHC 33.7 31.5 - 35.7 g/dL   RDW 04.5 40.9 - 81.1 %   Platelets 295 150 - 450 x10E3/uL   Neutrophils 59 Not Estab. %   Lymphs 29 Not Estab. %   Monocytes 8 Not Estab. %   Eos 3 Not Estab. %   Basos 1 Not Estab. %   Neutrophils Absolute 3.4 1.4 - 7.0 x10E3/uL   Lymphocytes Absolute 1.7 0.7 - 3.1 x10E3/uL   Monocytes Absolute 0.4 0.1 - 0.9 x10E3/uL   EOS (ABSOLUTE) 0.2 0.0 - 0.4 x10E3/uL   Basophils Absolute 0.1 0.0 - 0.2 x10E3/uL   Immature Granulocytes 0 Not Estab. %   Immature Grans (Abs) 0.0 0.0 - 0.1 x10E3/uL  Comprehensive metabolic panel  Result Value Ref Range   Glucose 96 70 - 99 mg/dL   BUN 18 6 - 24 mg/dL   Creatinine, Ser 9.14 0.57 - 1.00 mg/dL   eGFR 782 >95 AO/ZHY/8.65   BUN/Creatinine Ratio 29 (H) 9 - 23   Sodium 142 134 - 144 mmol/L   Potassium 4.2 3.5 - 5.2 mmol/L   Chloride 104 96 - 106 mmol/L   CO2 22 20 - 29 mmol/L   Calcium 9.4 8.7 - 10.2 mg/dL   Total Protein 7.0 6.0 - 8.5 g/dL   Albumin 4.4 3.8 - 4.9 g/dL   Globulin, Total 2.6 1.5 - 4.5 g/dL   Albumin/Globulin Ratio 1.7 1.2 - 2.2   Bilirubin Total 0.3 0.0 - 1.2 mg/dL   Alkaline Phosphatase 120 44 - 121 IU/L   AST 19 0 - 40 IU/L   ALT 14 0 - 32 IU/L  Lipid Panel w/o Chol/HDL Ratio  Result Value Ref Range   Cholesterol, Total 244 (H) 100 - 199 mg/dL   Triglycerides 784 (H) 0 - 149 mg/dL   HDL 49 >69 mg/dL   VLDL  Cholesterol Cal 37 5 - 40 mg/dL   LDL Chol Calc (NIH) 130 (H) 0 - 99 mg/dL   TSH  Result Value Ref Range   TSH 2.160 0.450 - 4.500 uIU/mL  VITAMIN D 25 Hydroxy (Vit-D Deficiency, Fractures)  Result Value Ref Range   Vit D, 25-Hydroxy 26.5 (L) 30.0 - 100.0 ng/mL  Hepatitis C antibody  Result Value Ref Range   Hep C Virus Ab Non Reactive Non Reactive  HIV Antibody (routine testing w rflx)  Result Value Ref Range   HIV Screen 4th Generation wRfx Non Reactive Non Reactive  HgB A1c  Result Value Ref Range   Hgb A1c MFr Bld 5.6 4.8 - 5.6 %   Est. average glucose Bld gHb Est-mCnc 114 mg/dL  Cytology - PAP  Result Value Ref Range   High risk HPV Negative    Adequacy      Satisfactory for evaluation; transformation zone component PRESENT.   Diagnosis      - Negative for intraepithelial lesion or malignancy (NILM)   Comment Normal Reference Range HPV - Negative       Assessment & Plan:   Problem List Items Addressed This Visit       Endocrine   IFG (impaired fasting glucose)   Relevant Orders   HgB A1c     Other   BMI 29.0-29.9,adult   Elevated low density lipoprotein (LDL) cholesterol level - Primary   Relevant Orders   Comprehensive metabolic panel   Lipid Panel w/o Chol/HDL Ratio   Vitamin D deficiency   Relevant Orders   VITAMIN D 25 Hydroxy (Vit-D Deficiency, Fractures)   Other Visit Diagnoses     Encounter for annual physical exam       Annual physical today with labs and health maintenance reviewed, discussed with patient.   Relevant Orders   CBC with Differential/Platelet   TSH        Follow up plan: Return in about 1 year (around 01/25/2024) for Annual Physical.   LABORATORY TESTING:  - Pap smear: Up To Date  IMMUNIZATIONS:   - Tdap: Tetanus vaccination status reviewed: Td vaccination indicated and given today. - Influenza: Up to date - Pneumovax: Not applicable - Prevnar: Not applicable - COVID: Up to date - HPV: Not applicable - Shingrix vaccine: Not Up To Date  SCREENING: -Mammogram: Up to date  - Colonoscopy: Up  to date  - Bone Density: Not applicable  -Hearing Test: Not applicable  -Spirometry: Not applicable   PATIENT COUNSELING:   Advised to take 1 mg of folate supplement per day if capable of pregnancy.   Sexuality: Discussed sexually transmitted diseases, partner selection, use of condoms, avoidance of unintended pregnancy  and contraceptive alternatives.   Advised to avoid cigarette smoking.  I discussed with the patient that most people either abstain from alcohol or drink within safe limits (<=14/week and <=4 drinks/occasion for males, <=7/weeks and <= 3 drinks/occasion for females) and that the risk for alcohol disorders and other health effects rises proportionally with the number of drinks per week and how often a drinker exceeds daily limits.  Discussed cessation/primary prevention of drug use and availability of treatment for abuse.   Diet: Encouraged to adjust caloric intake to maintain  or achieve ideal body weight, to reduce intake of dietary saturated fat and total fat, to limit sodium intake by avoiding high sodium foods and not adding table salt, and to maintain adequate dietary potassium and calcium preferably from fresh fruits, vegetables, and low-fat  dairy products.    Stressed the importance of regular exercise  Injury prevention: Discussed safety belts, safety helmets, smoke detector, smoking near bedding or upholstery.   Dental health: Discussed importance of regular tooth brushing, flossing, and dental visits.    NEXT PREVENTATIVE PHYSICAL DUE IN 1 YEAR. Return in about 1 year (around 01/25/2024) for Annual Physical.

## 2023-01-26 LAB — LIPID PANEL W/O CHOL/HDL RATIO
Cholesterol, Total: 226 mg/dL — ABNORMAL HIGH (ref 100–199)
HDL: 53 mg/dL (ref >39)
LDL Chol Calc (NIH): 139 mg/dL — ABNORMAL HIGH (ref 0–99)
Triglycerides: 193 mg/dL — ABNORMAL HIGH (ref 0–149)
VLDL Cholesterol Cal: 34 mg/dL (ref 5–40)

## 2023-01-26 LAB — CBC WITH DIFFERENTIAL/PLATELET
Basophils Absolute: 0.1 10*3/uL (ref 0.0–0.2)
Basos: 1 %
EOS (ABSOLUTE): 0.4 10*3/uL (ref 0.0–0.4)
Eos: 5 %
Hematocrit: 40.2 % (ref 34.0–46.6)
Hemoglobin: 13.4 g/dL (ref 11.1–15.9)
Immature Grans (Abs): 0 10*3/uL (ref 0.0–0.1)
Immature Granulocytes: 0 %
Lymphocytes Absolute: 2.1 10*3/uL (ref 0.7–3.1)
Lymphs: 32 %
MCH: 30.9 pg (ref 26.6–33.0)
MCHC: 33.3 g/dL (ref 31.5–35.7)
MCV: 93 fL (ref 79–97)
Monocytes Absolute: 0.6 10*3/uL (ref 0.1–0.9)
Monocytes: 9 %
Neutrophils Absolute: 3.4 10*3/uL (ref 1.4–7.0)
Neutrophils: 53 %
Platelets: 265 10*3/uL (ref 150–450)
RBC: 4.34 x10E6/uL (ref 3.77–5.28)
RDW: 12.2 % (ref 11.7–15.4)
WBC: 6.5 10*3/uL (ref 3.4–10.8)

## 2023-01-26 LAB — COMPREHENSIVE METABOLIC PANEL
ALT: 16 [IU]/L (ref 0–32)
AST: 22 [IU]/L (ref 0–40)
Albumin: 4.4 g/dL (ref 3.8–4.9)
Alkaline Phosphatase: 106 [IU]/L (ref 44–121)
BUN/Creatinine Ratio: 30 — ABNORMAL HIGH (ref 9–23)
BUN: 18 mg/dL (ref 6–24)
Bilirubin Total: 0.3 mg/dL (ref 0.0–1.2)
CO2: 25 mmol/L (ref 20–29)
Calcium: 9.8 mg/dL (ref 8.7–10.2)
Chloride: 104 mmol/L (ref 96–106)
Creatinine, Ser: 0.6 mg/dL (ref 0.57–1.00)
Globulin, Total: 2.3 g/dL (ref 1.5–4.5)
Glucose: 77 mg/dL (ref 70–99)
Potassium: 4.2 mmol/L (ref 3.5–5.2)
Sodium: 142 mmol/L (ref 134–144)
Total Protein: 6.7 g/dL (ref 6.0–8.5)
eGFR: 104 mL/min/{1.73_m2} (ref >59)

## 2023-01-26 LAB — TSH: TSH: 2.24 u[IU]/mL (ref 0.450–4.500)

## 2023-01-26 LAB — HEMOGLOBIN A1C
Est. average glucose Bld gHb Est-mCnc: 123 mg/dL
Hgb A1c MFr Bld: 5.9 % — ABNORMAL HIGH (ref 4.8–5.6)

## 2023-01-26 LAB — VITAMIN D 25 HYDROXY (VIT D DEFICIENCY, FRACTURES): Vit D, 25-Hydroxy: 27.7 ng/mL — ABNORMAL LOW (ref 30.0–100.0)

## 2023-01-26 NOTE — Progress Notes (Signed)
Contacted via MyChart -- however is Spanish speaking only so please call to ensure she saw results and had assist with reviewing:  Good afternoon Leslie Spencer, your labs have returned: - Kidney function, creatinine and eGFR, remains normal, as is liver function, AST and ALT.  - Vitamin D remains a little low, continue to take Vitamin D3 2000 units daily for bone health. - A1c is diabetes testing, on this visit it did sneak up a little to prediabetic range at 5.9%.  Any number 5.7 to 6.4 is considered prediabetes and any number 6.5 or greater is considered diabetes.  I would recommend heavy focus on decreasing foods high in sugar and your intake of things like bread products, pasta, and rice. The American Diabetes Association online has a large amount of information on diet changes to make. We will recheck next year. - CBC shows no anemia or infection.  Thyroid level is normal. - Lipid panel continues to show some elevations, but do not need medication at this time.  Continue focus on healthy diet and exercise.  Any questions? Keep being amazing!!  Thank you for allowing me to participate in your care.  I appreciate you. Kindest regards, Johntae Broxterman

## 2023-02-21 DIAGNOSIS — M9904 Segmental and somatic dysfunction of sacral region: Secondary | ICD-10-CM | POA: Diagnosis not present

## 2023-02-21 DIAGNOSIS — M9905 Segmental and somatic dysfunction of pelvic region: Secondary | ICD-10-CM | POA: Diagnosis not present

## 2023-02-21 DIAGNOSIS — M955 Acquired deformity of pelvis: Secondary | ICD-10-CM | POA: Diagnosis not present

## 2023-02-21 DIAGNOSIS — M5418 Radiculopathy, sacral and sacrococcygeal region: Secondary | ICD-10-CM | POA: Diagnosis not present

## 2023-02-26 DIAGNOSIS — M955 Acquired deformity of pelvis: Secondary | ICD-10-CM | POA: Diagnosis not present

## 2023-02-26 DIAGNOSIS — M5418 Radiculopathy, sacral and sacrococcygeal region: Secondary | ICD-10-CM | POA: Diagnosis not present

## 2023-02-26 DIAGNOSIS — M9904 Segmental and somatic dysfunction of sacral region: Secondary | ICD-10-CM | POA: Diagnosis not present

## 2023-02-26 DIAGNOSIS — M9905 Segmental and somatic dysfunction of pelvic region: Secondary | ICD-10-CM | POA: Diagnosis not present

## 2023-04-10 ENCOUNTER — Other Ambulatory Visit: Payer: Self-pay | Admitting: Nurse Practitioner

## 2023-04-10 ENCOUNTER — Encounter: Payer: Self-pay | Admitting: Nurse Practitioner

## 2023-04-10 DIAGNOSIS — Z1231 Encounter for screening mammogram for malignant neoplasm of breast: Secondary | ICD-10-CM

## 2023-05-15 ENCOUNTER — Ambulatory Visit
Admission: RE | Admit: 2023-05-15 | Discharge: 2023-05-15 | Disposition: A | Discharge status: Home / Self Care | Payer: BC Managed Care – PPO | Source: Ambulatory Visit | Attending: Nurse Practitioner | Admitting: Nurse Practitioner

## 2023-05-15 DIAGNOSIS — Z1231 Encounter for screening mammogram for malignant neoplasm of breast: Secondary | ICD-10-CM | POA: Diagnosis not present

## 2023-05-18 ENCOUNTER — Encounter: Payer: Self-pay | Admitting: Nurse Practitioner

## 2023-05-18 NOTE — Progress Notes (Signed)
 Contacted via MyChart   Normal mammogram, may repeat in one year:)

## 2023-06-25 ENCOUNTER — Other Ambulatory Visit: Payer: Self-pay | Admitting: Nurse Practitioner

## 2023-06-26 ENCOUNTER — Other Ambulatory Visit: Payer: Self-pay | Admitting: Nurse Practitioner

## 2023-06-26 NOTE — Telephone Encounter (Signed)
 Copied from CRM [id number]. Topic: Clinical - Medication Refill >> Jun 26, 2023  2:30 PM Nyra Capes wrote: Most Recent Primary Care Visit:  Provider: Aura Dials T  Department: [id number]  Visit Type: PHYSICAL  Date: 01/25/2023  Medication: loratadine (CLARITIN) 10 MG tablet  Has the patient contacted their pharmacy? Yes, on 06/25/23  pharmacy stated they need a new prescription sent in  (Agent: If no, request that the patient contact the pharmacy for the refill. If patient does not wish to contact the pharmacy document the reason why and proceed with request.) (Agent: If yes, when and what did the pharmacy advise?)  Is this the correct pharmacy for this prescription? Yes If no, delete pharmacy and type the correct one.  This is the patient's preferred pharmacy:  [id number] - [place], [place] - [address] [place] [address] Phone: [phone] Fax: [phone]   Has the prescription been filled recently? No  Is the patient out of the medication? Yes  Has the patient been seen for an appointment in the last year OR does the patient have an upcoming appointment? Yes  Can we respond through MyChart? Yes  Agent: Please be advised that Rx refills may take up to 3 business days. We ask that you follow-up with your pharmacy.

## 2023-06-27 NOTE — Telephone Encounter (Signed)
 Requested Prescriptions  Pending Prescriptions Disp Refills   loratadine (CLARITIN) 10 MG tablet [Pharmacy Med Name: LORATADINE 10 MG TABLET] 90 tablet 1    Sig: TAKE 1 TABLET BY MOUTH EVERY DAY     Ear, Nose, and Throat:  Antihistamines 2 Failed - 06/27/2023 11:00 AM      Failed - Valid encounter within last 12 months    Recent Outpatient Visits   None     Future Appointments             In 7 months Cannady, Jolene T, NP [place] [facility]            Passed - Cr in normal range and within 360 days    Creatinine, Ser  Date Value Ref Range Status  01/25/2023 0.60 0.57 - 1.00 mg/dL Final

## 2023-06-30 IMAGING — MG MM DIGITAL SCREENING BILAT W/ TOMO AND CAD
6 of 10 series · 6 of 30 positions shown · non-contrast
Comparison: Previous exam(s).

CLINICAL DATA: Screening.

EXAM:
DIGITAL SCREENING BILATERAL MAMMOGRAM WITH TOMOSYNTHESIS AND CAD
TECHNIQUE: Bilateral screening digital craniocaudal and mediolateral oblique
mammograms were obtained. Bilateral screening digital breast
tomosynthesis was performed. The images were evaluated with
computer-aided detection.

[R CC synth-2D (1 of 2)]
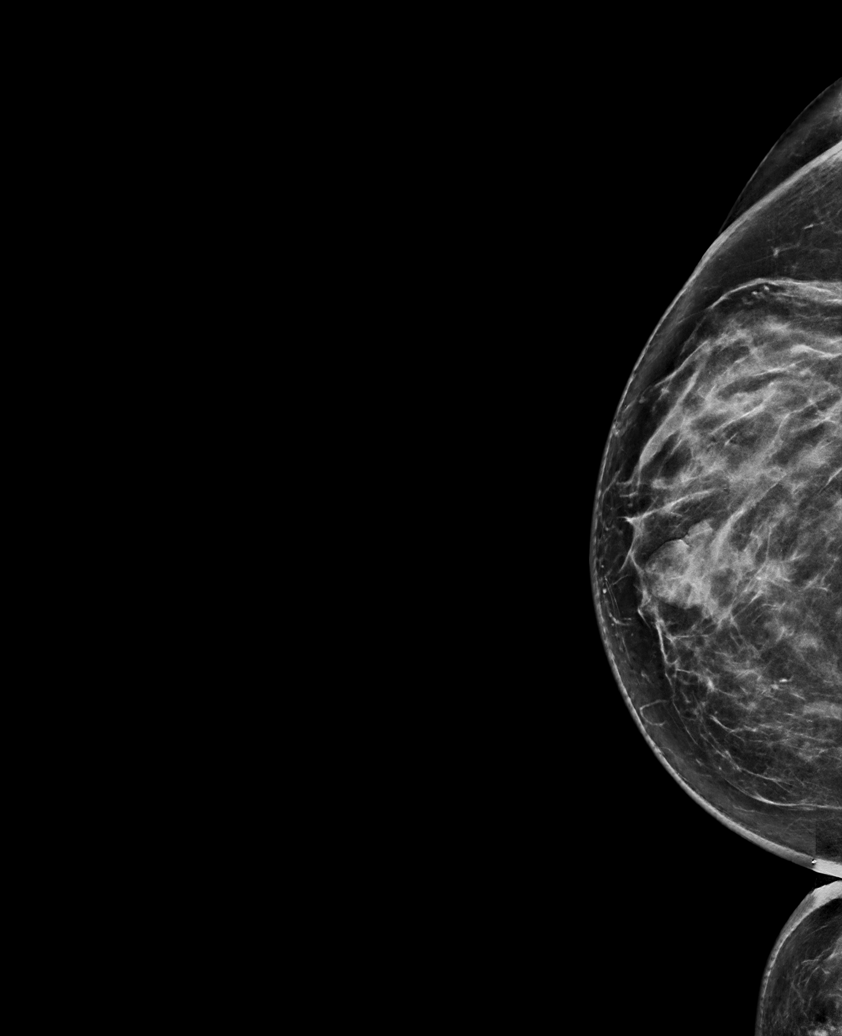

[L CC synth-2D]
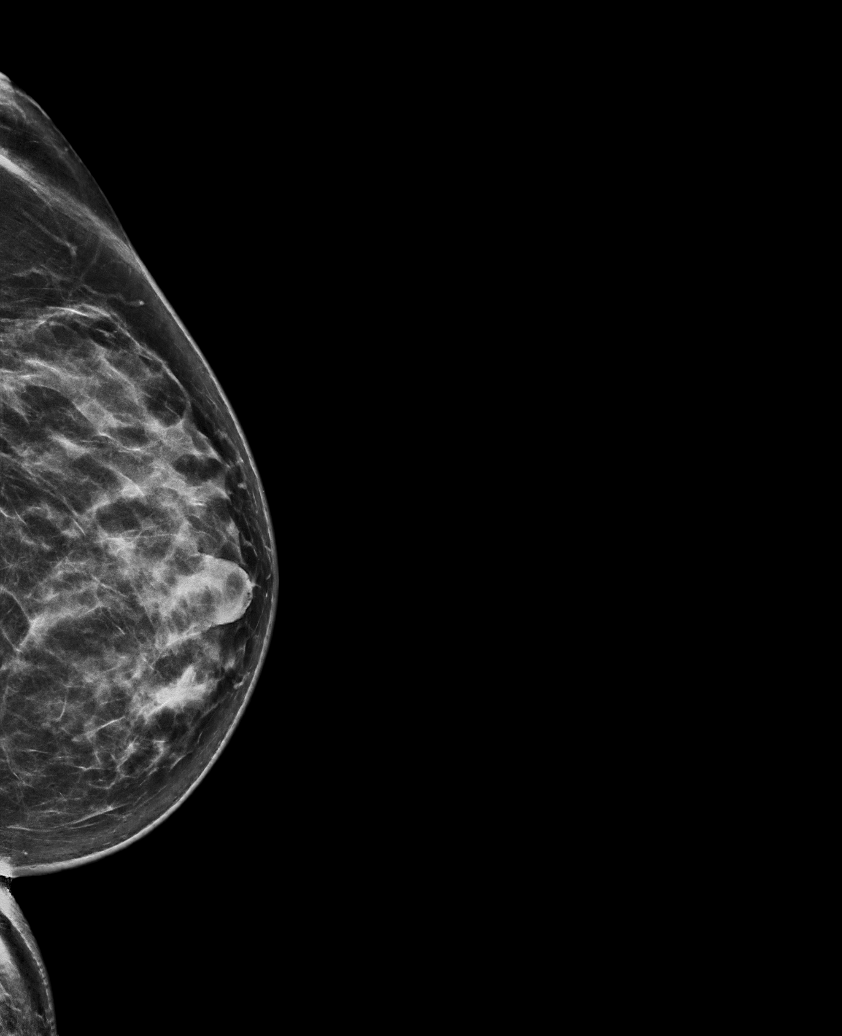

[R CC synth-2D (2 of 2)]
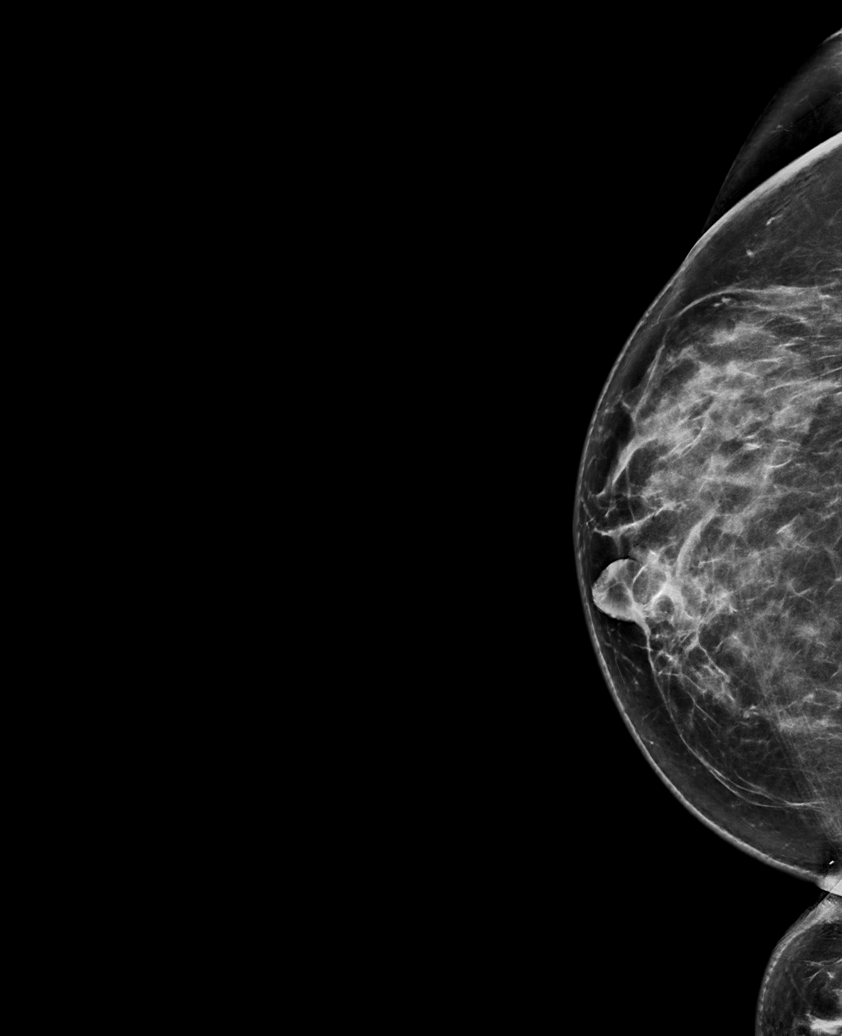

[R MLO synth-2D]
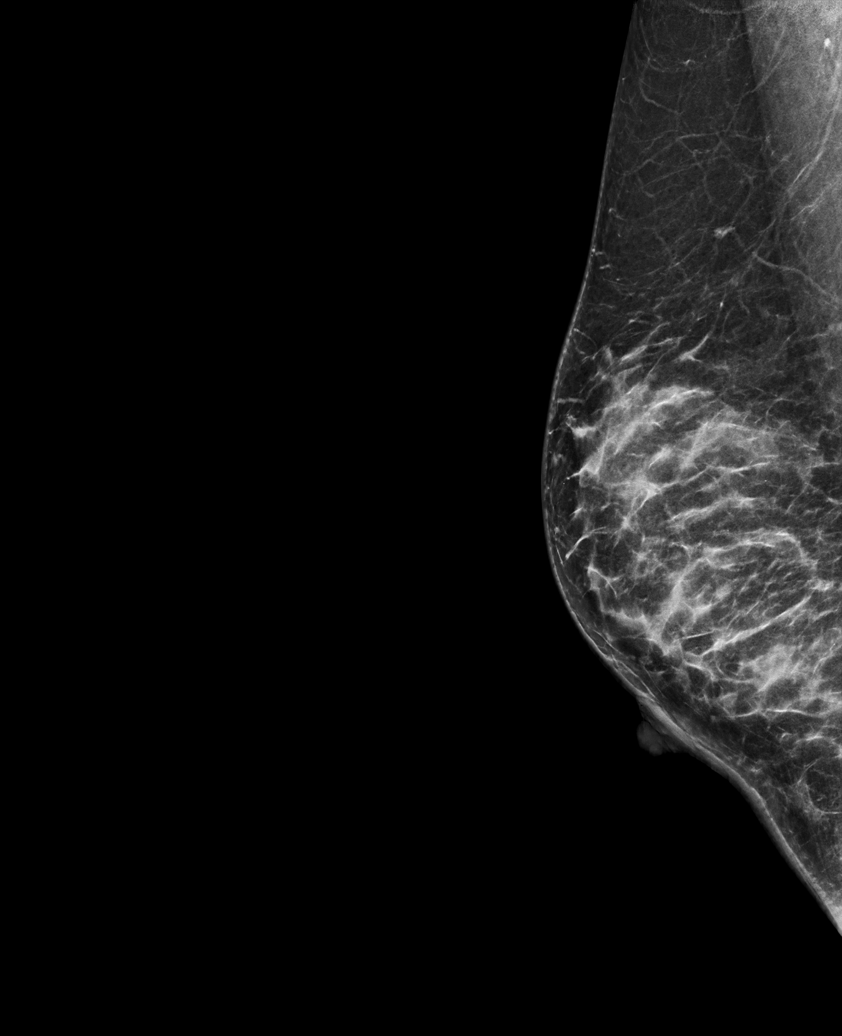

[L MLO synth-2D]
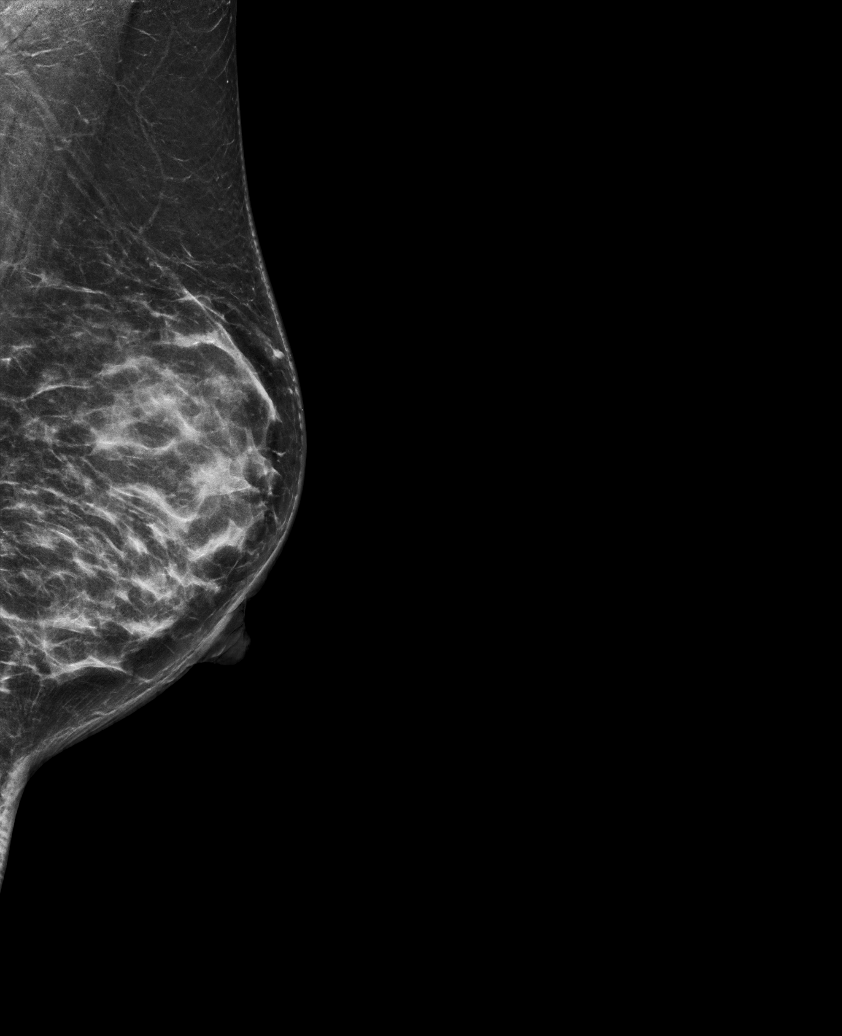

[R CC tomo · tomo slice 41/81.0]
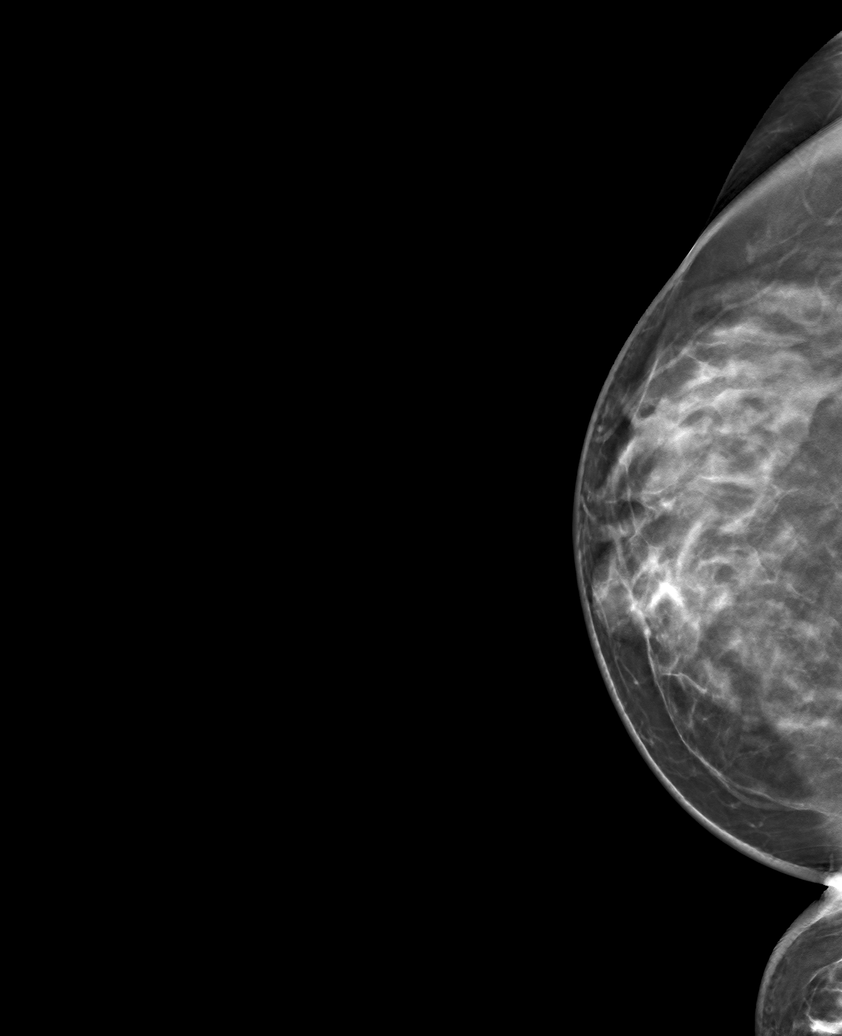

[6 of 30 positions shown; findings below may reference images not displayed]

ACR Breast Density Category c: The breast tissue is heterogeneously
dense, which may obscure small masses.
FINDINGS: There are no findings suspicious for malignancy.
IMPRESSION: No mammographic evidence of malignancy. A result letter of this
screening mammogram will be mailed directly to the patient.

RECOMMENDATION:
Screening mammogram in one year. (Code:Q3-W-BC3)

BI-RADS CATEGORY  1: Negative.

## 2023-10-23 DIAGNOSIS — L578 Other skin changes due to chronic exposure to nonionizing radiation: Secondary | ICD-10-CM | POA: Diagnosis not present

## 2023-10-23 DIAGNOSIS — Z85828 Personal history of other malignant neoplasm of skin: Secondary | ICD-10-CM | POA: Diagnosis not present

## 2024-01-26 NOTE — Patient Instructions (Signed)
 Be Involved in Caring For Your Health:  Taking Medications When medications are taken as directed, they can greatly improve your health. But if they are not taken as prescribed, they may not work. In some cases, not taking them correctly can be harmful. To help ensure your treatment remains effective and safe, understand your medications and how to take them. Bring your medications to each visit for review by your provider.  Your lab results, notes, and after visit summary will be available on My Chart. We strongly encourage you to use this feature. If lab results are abnormal the clinic will contact you with the appropriate steps. If the clinic does not contact you assume the results are satisfactory. You can always view your results on My Chart. If you have questions regarding your health or results, please contact the clinic during office hours. You can also ask questions on My Chart.  We at [facility] are grateful that you chose us  to provide your care. We strive to provide evidence-based and compassionate care and are always looking for feedback. If you get a survey from the clinic please complete this so we can hear your opinions.  Healthy Eating, Adult Healthy eating may help you get and keep a healthy body weight, reduce the risk of chronic disease, and live a long and productive life. It is important to follow a healthy eating pattern. Your nutritional and calorie needs should be met mainly by different nutrient-rich foods. What are tips for following this plan? Reading food labels Read labels and choose the following: Reduced or low sodium products. Juices with 100% fruit juice. Foods with low saturated fats (<3 g per serving) and high polyunsaturated and monounsaturated fats. Foods with whole grains, such as whole wheat, cracked wheat, brown rice, and wild rice. Whole grains that are fortified with folic acid. This is recommended for females who are pregnant or who want to  become pregnant. Read labels and do not eat or drink the following: Foods or drinks with added sugars. These include foods that contain brown sugar, corn sweetener, corn syrup, dextrose , fructose, glucose, high-fructose corn syrup, honey, invert sugar, lactose, malt syrup, maltose, molasses, raw sugar, sucrose, trehalose, or turbinado sugar. Limit your intake of added sugars to less than 10% of your total daily calories. Do not eat more than the following amounts of added sugar per day: 6 teaspoons (25 g) for females. 9 teaspoons (38 g) for males. Foods that contain processed or refined starches and grains. Refined grain products, such as white flour, degermed cornmeal, white bread, and white rice. Shopping Choose nutrient-rich snacks, such as vegetables, whole fruits, and nuts. Avoid high-calorie and high-sugar snacks, such as potato chips, fruit snacks, and candy. Use oil-based dressings and spreads on foods instead of solid fats such as butter, margarine, sour cream, or cream cheese. Limit pre-made sauces, mixes, and instant products such as flavored rice, instant noodles, and ready-made pasta. Try more plant-protein sources, such as tofu, tempeh, black beans, edamame, lentils, nuts, and seeds. Explore eating plans such as the Mediterranean diet or vegetarian diet. Try heart-healthy dips made with beans and healthy fats like hummus and guacamole. Vegetables go great with these. Cooking Use oil to saut or stir-fry foods instead of solid fats such as butter, margarine, or lard. Try baking, boiling, grilling, or broiling instead of frying. Remove the fatty part of meats before cooking. Steam vegetables in water  or broth. Meal planning  At meals, imagine dividing your plate into fourths: One-half of  your plate is fruits and vegetables. One-fourth of your plate is whole grains. One-fourth of your plate is protein, especially lean meats, poultry, eggs, tofu, beans, or nuts. Include low-fat  dairy as part of your daily diet. Lifestyle Choose healthy options in all settings, including home, work, school, restaurants, or stores. Prepare your food safely: Wash your hands after handling raw meats. Where you prepare food, keep surfaces clean by regularly washing with hot, soapy water . Keep raw meats separate from ready-to-eat foods, such as fruits and vegetables. Cook seafood, meat, poultry, and eggs to the recommended temperature. Get a food thermometer. Store foods at safe temperatures. In general: Keep cold foods at 84F (4.4C) or below. Keep hot foods at 184F (60C) or above. Keep your freezer at [facility] (-17.8C) or below. Foods are not safe to eat if they have been between the temperatures of 40-184F (4.4-60C) for more than 2 hours. What foods should I eat? Fruits Aim to eat 1-2 cups of fresh, canned (in natural juice), or frozen fruits each day. One cup of fruit equals 1 small apple, 1 large banana, 8 large strawberries, 1 cup (237 g) canned fruit,  cup (82 g) dried fruit, or 1 cup (240 mL) 100% juice. Vegetables Aim to eat 2-4 cups of fresh and frozen vegetables each day, including different varieties and colors. One cup of vegetables equals 1 cup (91 g) broccoli or cauliflower florets, 2 medium carrots, 2 cups (150 g) raw, leafy greens, 1 large tomato, 1 large bell pepper, 1 large sweet potato, or 1 medium white potato. Grains Aim to eat 5-10 ounce-equivalents of whole grains each day. Examples of 1 ounce-equivalent of grains include 1 slice of bread, 1 cup (40 g) ready-to-eat cereal, 3 cups (24 g) popcorn, or  cup (93 g) cooked rice. Meats and other proteins Try to eat 5-7 ounce-equivalents of protein each day. Examples of 1 ounce-equivalent of protein include 1 egg,  oz nuts (12 almonds, 24 pistachios, or 7 walnut halves), 1/4 cup (90 g) cooked beans, 6 tablespoons (90 g) hummus or 1 tablespoon (16 g) peanut butter. A cut of meat or fish that is the size of a deck of  cards is about 3-4 ounce-equivalents (85 g). Of the protein you eat each week, try to have at least 8 sounce (227 g) of seafood. This is about 2 servings per week. This includes salmon, trout, herring, sardines, and anchovies. Dairy Aim to eat 3 cup-equivalents of fat-free or low-fat dairy each day. Examples of 1 cup-equivalent of dairy include 1 cup (240 mL) milk, 8 ounces (250 g) yogurt, 1 ounces (44 g) natural cheese, or 1 cup (240 mL) fortified soy milk. Fats and oils Aim for about 5 teaspoons (21 g) of fats and oils per day. Choose monounsaturated fats, such as canola and olive oils, mayonnaise made with olive oil or avocado oil, avocados, peanut butter, and most nuts, or polyunsaturated fats, such as sunflower, corn, and soybean oils, walnuts, pine nuts, sesame seeds, sunflower seeds, and flaxseed. Beverages Aim for 6 eight-ounce glasses of water  per day. Limit coffee to 3-5 eight-ounce cups per day. Limit caffeinated beverages that have added calories, such as soda and energy drinks. If you drink alcohol: Limit how much you have to: 0-1 drink a day if you are female. 0-2 drinks a day if you are female. Know how much alcohol is in your drink. In the [place] one drink is one 12 oz bottle of beer (355 mL), one 5 oz glass of wine (  148 mL), or one 1 oz glass of hard liquor (44 mL). Seasoning and other foods Try not to add too much salt to your food. Try using herbs and spices instead of salt. Try not to add sugar to food. This information is based on [place] nutrition guidelines. To learn more, visit [employer]. Exact amounts may vary. You may need different amounts. This information is not intended to replace advice given to you by your health care provider. Make sure you discuss any questions you have with your health care provider. Document Revised: 12/12/2021 Document Reviewed: 12/12/2021 Elsevier Patient Education  2024 [employer].

## 2024-01-28 ENCOUNTER — Encounter: Payer: Self-pay | Admitting: Nurse Practitioner

## 2024-01-28 ENCOUNTER — Ambulatory Visit (INDEPENDENT_AMBULATORY_CARE_PROVIDER_SITE_OTHER): Payer: Self-pay | Admitting: Nurse Practitioner

## 2024-01-28 VITALS — BP 107/71 | HR 60 | Temp 98.7°F | Ht 62.5 in | Wt 158.0 lb

## 2024-01-28 DIAGNOSIS — R7301 Impaired fasting glucose: Secondary | ICD-10-CM

## 2024-01-28 DIAGNOSIS — E7800 Pure hypercholesterolemia, unspecified: Secondary | ICD-10-CM

## 2024-01-28 DIAGNOSIS — Z0000 Encounter for general adult medical examination without abnormal findings: Secondary | ICD-10-CM | POA: Diagnosis not present

## 2024-01-28 DIAGNOSIS — Z23 Encounter for immunization: Secondary | ICD-10-CM

## 2024-01-28 DIAGNOSIS — E559 Vitamin D deficiency, unspecified: Secondary | ICD-10-CM | POA: Diagnosis not present

## 2024-01-28 DIAGNOSIS — Z6829 Body mass index (BMI) 29.0-29.9, adult: Secondary | ICD-10-CM

## 2024-01-28 LAB — BAYER DCA HB A1C WAIVED: HB A1C (BAYER DCA - WAIVED): 5.2 % (ref 4.8–5.6)

## 2024-01-28 NOTE — Assessment & Plan Note (Signed)
 Chronic, stable.  Noted on past labs, continue supplements and check labs today.

## 2024-01-28 NOTE — Assessment & Plan Note (Signed)
 Noted past labs, recheck today and start medications as needed in future. At present focus on healthy diet and regular exercise. The 10-year ASCVD risk score (Arnett DK, et al., 2019) is: 2.4%   Values used to calculate the score:     Age: 58 years     Clincally relevant sex: Female     Is Non-Hispanic African American: No     Diabetic: No     Tobacco smoker: No     Systolic Blood Pressure: 107 mmHg     Is BP treated: No     HDL Cholesterol: 53 mg/dL     Total Cholesterol: 226 mg/dL

## 2024-01-28 NOTE — Assessment & Plan Note (Addendum)
 Noted on past labs, check A1c today and annually if levels in prediabetic range.

## 2024-01-28 NOTE — Assessment & Plan Note (Signed)
 BMI 28.44. Praised for her exercising 5 days a week, she has lost some weight  Recommended eating smaller high protein, low fat meals more frequently and exercising 30 mins a day 5 times a week with a goal of 10-15lb weight loss in the next 3 months. Patient voiced their understanding and motivation to adhere to these recommendations.

## 2024-01-28 NOTE — Progress Notes (Signed)
 BP 107/71   Pulse 60   Temp 98.7 F (37.1 C) (Oral)   Ht 5' 2.5 (1.588 m)   Wt 158 lb (71.7 kg)   LMP 08/08/2020 (Approximate)   SpO2 98%   BMI 28.44 kg/m    Subjective:    Patient ID: Leslie Spencer, female    DOB: 11-11-1965, 58 y.o.   MRN: [id number]  Interpreter at bedside to assist with visit.  HPI: Leslie Spencer is a 58 y.o. female presenting on 01/28/2024 for comprehensive medical examination. Current medical complaints include:none  She currently lives with: husband Menopausal Symptoms: no  The 10-year ASCVD risk score (Arnett DK, et al., 2019) is: 2.4%   Values used to calculate the score:     Age: 47 years     Clincally relevant sex: Female     Is Non-Hispanic African American: No     Diabetic: No     Tobacco smoker: No     Systolic Blood Pressure: 107 mmHg     Is BP treated: No     HDL Cholesterol: 53 mg/dL     Total Cholesterol: 226 mg/dL     88/08/7972    1:64 AM 01/25/2023    8:43 AM 01/23/2022    8:47 AM 01/20/2021   10:26 AM  Depression screen PHQ 2/9  Decreased Interest 0 0 0 0  Down, Depressed, Hopeless 0 0 0 0  PHQ - 2 Score 0 0 0 0  Altered sleeping 0 0 1 1  Tired, decreased energy 0 0 0 0  Change in appetite 0 0 0 0  Feeling bad or failure about yourself  0 0 0 0  Trouble concentrating 0 0 0 0  Moving slowly or fidgety/restless 0 0 0 0  Suicidal thoughts 0 0 0 0  PHQ-9 Score 0 0 1 1  Difficult doing work/chores Not difficult at all  Not difficult at all Not difficult at all       01/28/2024    8:35 AM 01/25/2023    8:43 AM 01/23/2022    8:47 AM 01/20/2021   10:26 AM  GAD 7 : Generalized Anxiety Score  Nervous, Anxious, on Edge 0 0 0 0  Control/stop worrying 0 0 0 0  Worry too much - different things 0 0 0 0  Trouble relaxing 0 0 0 0  Restless 0 0 0 0  Easily annoyed or irritable 0 0 0 0  Afraid - awful might happen 0 0 0 0  Total GAD 7 Score 0 0 0 0  Anxiety Difficulty Not difficult at all  Not difficult at all Not  difficult at all      01/20/2021   10:20 AM 01/27/2021   10:10 AM 01/23/2022    8:47 AM  Fall Risk  Falls in the past year? 0  0  Was there an injury with Fall? 0  0  Fall Risk Category Calculator 0  0  Fall Risk Category (Retired) Low   Low   (RETIRED) Patient Fall Risk Level Low fall risk  Low fall risk  Low fall risk   Patient at Risk for Falls Due to No Fall Risks  No Fall Risks  Fall risk Follow up Falls evaluation completed   Falls evaluation completed      Data saved with a previous flowsheet row definition    Functional Status Survey:      Past Medical History:  Past Medical History:  Diagnosis Date  . Medical history non-contributory  Surgical History:  Past Surgical History:  Procedure Laterality Date  . CESAREAN SECTION    . COLONOSCOPY WITH PROPOFOL  N/A 01/27/2021   Procedure: COLONOSCOPY WITH PROPOFOL ;  Surgeon: Jinny Carmine, MD;  Location: [facility];  Service: Endoscopy;  Laterality: N/A;  . POLYPECTOMY  01/27/2021   Procedure: POLYPECTOMY;  Surgeon: Jinny Carmine, MD;  Location: [facility];  Service: Endoscopy;;    Medications:  Current Outpatient Medications on File Prior to Visit  Medication Sig  . loratadine  (CLARITIN ) 10 MG tablet TAKE 1 TABLET BY MOUTH EVERY DAY   No current facility-administered medications on file prior to visit.    Allergies:  No Known Allergies  Social History:  Social History   Socioeconomic History  . Marital status: Married    Spouse name: Not on file  . Number of children: Not on file  . Years of education: Not on file  . Highest education level: Not on file  Occupational History  . Not on file  Tobacco Use  . Smoking status: Never  . Smokeless tobacco: Never  Vaping Use  . Vaping status: Never Used  Substance and Sexual Activity  . Alcohol use: No  . Drug use: No  . Sexual activity: Yes  Other Topics Concern  . Not on file  Social History Narrative  . Not on file   Social Drivers  of Health   Financial Resource Strain: Low Risk  (01/20/2021)   Overall Financial Resource Strain (CARDIA)   . Difficulty of Paying Living Expenses: Not hard at all  Food Insecurity: No Food Insecurity (01/20/2021)   Hunger Vital Sign   . Worried About Programme Researcher, Broadcasting/film/video in the Last Year: Never true   . Ran Out of Food in the Last Year: Never true  Transportation Needs: No Transportation Needs (01/20/2021)   PRAPARE - Transportation   . Lack of Transportation (Medical): No   . Lack of Transportation (Non-Medical): No  Physical Activity: Sufficiently Active (01/20/2021)   Exercise Vital Sign   . Days of Exercise per Week: 5 days   . Minutes of Exercise per Session: 30 min  Stress: No Stress Concern Present (01/20/2021)   [employer] of Occupational Health - Occupational Stress Questionnaire   . Feeling of Stress : Not at all  Social Connections: Moderately Isolated (01/20/2021)   Social Connection and Isolation Panel   . Frequency of Communication with Friends and Family: More than three times a week   . Frequency of Social Gatherings with Friends and Family: More than three times a week   . Attends Religious Services: Never   . Active Member of Clubs or Organizations: No   . Attends Banker Meetings: Never   . Marital Status: Married  Catering Manager Violence: Not At Risk (01/20/2021)   Humiliation, Afraid, Rape, and Kick questionnaire   . Fear of Current or Ex-Partner: No   . Emotionally Abused: No   . Physically Abused: No   . Sexually Abused: No   Social History   Tobacco Use  Smoking Status Never  Smokeless Tobacco Never   Social History   Substance and Sexual Activity  Alcohol Use No    Family History:  Family History  Problem Relation Age of Onset  . Heart disease Father   . Breast cancer Neg Hx     Past medical history, surgical history, medications, allergies, family history and social history reviewed with patient today and changes  made to appropriate areas of the chart.  ROS All other ROS negative except what is listed above and in the HPI.      Objective:    BP 107/71   Pulse 60   Temp 98.7 F (37.1 C) (Oral)   Ht 5' 2.5 (1.588 m)   Wt 158 lb (71.7 kg)   LMP 08/08/2020 (Approximate)   SpO2 98%   BMI 28.44 kg/m   Wt Readings from Last 3 Encounters:  01/28/24 158 lb (71.7 kg)  01/25/23 162 lb 12.8 oz (73.8 kg)  01/23/22 164 lb 12.8 oz (74.8 kg)    Physical Exam Vitals and nursing note reviewed. Exam conducted with a chaperone present.  Constitutional:      General: She is awake. She is not in acute distress.    Appearance: She is well-developed and well-groomed. She is not ill-appearing or toxic-appearing.  HENT:     Head: Normocephalic and atraumatic.     Right Ear: Hearing, tympanic membrane, ear canal and external ear normal. No drainage.     Left Ear: Hearing, tympanic membrane, ear canal and external ear normal. No drainage.     Nose: Nose normal.     Right Sinus: No maxillary sinus tenderness or frontal sinus tenderness.     Left Sinus: No maxillary sinus tenderness or frontal sinus tenderness.     Mouth/Throat:     Mouth: Mucous membranes are moist.     Pharynx: Oropharynx is clear. Uvula midline. No pharyngeal swelling, oropharyngeal exudate or posterior oropharyngeal erythema.  Eyes:     General: Lids are normal.        Right eye: No discharge.        Left eye: No discharge.     Extraocular Movements: Extraocular movements intact.     Conjunctiva/sclera: Conjunctivae normal.     Pupils: Pupils are equal, round, and reactive to light.     Visual Fields: Right eye visual fields normal and left eye visual fields normal.  Neck:     Thyroid: No thyromegaly.     Vascular: No carotid bruit.     Trachea: Trachea normal.  Cardiovascular:     Rate and Rhythm: Normal rate and regular rhythm.     Heart sounds: Normal heart sounds. No murmur heard.    No gallop.  Pulmonary:     Effort:  Pulmonary effort is normal. No accessory muscle usage or respiratory distress.     Breath sounds: Normal breath sounds.  Chest:  Breasts:    Right: Normal.     Left: Normal.  Abdominal:     General: Bowel sounds are normal.     Palpations: Abdomen is soft. There is no hepatomegaly or splenomegaly.     Tenderness: There is no abdominal tenderness.  Musculoskeletal:        General: Normal range of motion.     Cervical back: Normal range of motion and neck supple.     Right lower leg: No edema.     Left lower leg: No edema.  Lymphadenopathy:     Head:     Right side of head: No submental, submandibular, tonsillar, preauricular or posterior auricular adenopathy.     Left side of head: No submental, submandibular, tonsillar, preauricular or posterior auricular adenopathy.     Cervical: No cervical adenopathy.     Upper Body:     Right upper body: No supraclavicular, axillary or pectoral adenopathy.     Left upper body: No supraclavicular, axillary or pectoral adenopathy.  Skin:    General: Skin is  warm and dry.     Capillary Refill: Capillary refill takes less than 2 seconds.     Findings: No rash.  Neurological:     Mental Status: She is alert and oriented to person, place, and time.     Gait: Gait is intact.     Deep Tendon Reflexes: Reflexes are normal and symmetric.     Reflex Scores:      Brachioradialis reflexes are 2+ on the right side and 2+ on the left side.      Patellar reflexes are 2+ on the right side and 2+ on the left side. Psychiatric:        Attention and Perception: Attention normal.        Mood and Affect: Mood normal.        Speech: Speech normal.        Behavior: Behavior normal. Behavior is cooperative.        Thought Content: Thought content normal.        Judgment: Judgment normal.    Results for orders placed or performed in visit on 01/25/23  CBC with Differential/Platelet   Collection Time: 01/25/23  8:44 AM  Result Value Ref Range   WBC 6.5 3.4 -  10.8 x10E3/uL   RBC 4.34 3.77 - 5.28 x10E6/uL   Hemoglobin 13.4 11.1 - 15.9 g/dL   Hematocrit 59.7 65.9 - 46.6 %   MCV 93 79 - 97 fL   MCH 30.9 26.6 - 33.0 pg   MCHC 33.3 31.5 - 35.7 g/dL   RDW 87.7 88.2 - 84.5 %   Platelets 265 150 - 450 x10E3/uL   Neutrophils 53 Not Estab. %   Lymphs 32 Not Estab. %   Monocytes 9 Not Estab. %   Eos 5 Not Estab. %   Basos 1 Not Estab. %   Neutrophils Absolute 3.4 1.4 - 7.0 x10E3/uL   Lymphocytes Absolute 2.1 0.7 - 3.1 x10E3/uL   Monocytes Absolute 0.6 0.1 - 0.9 x10E3/uL   EOS (ABSOLUTE) 0.4 0.0 - 0.4 x10E3/uL   Basophils Absolute 0.1 0.0 - 0.2 x10E3/uL   Immature Granulocytes 0 Not Estab. %   Immature Grans (Abs) 0.0 0.0 - 0.1 x10E3/uL  Comprehensive metabolic panel   Collection Time: 01/25/23  8:44 AM  Result Value Ref Range   Glucose 77 70 - 99 mg/dL   BUN 18 6 - 24 mg/dL   Creatinine, Ser 9.39 0.57 - 1.00 mg/dL   eGFR 895 >40 fO/fpw/8.26   BUN/Creatinine Ratio 30 (H) 9 - 23   Sodium 142 134 - 144 mmol/L   Potassium 4.2 3.5 - 5.2 mmol/L   Chloride 104 96 - 106 mmol/L   CO2 25 20 - 29 mmol/L   Calcium 9.8 8.7 - 10.2 mg/dL   Total Protein 6.7 6.0 - 8.5 g/dL   Albumin 4.4 3.8 - 4.9 g/dL   Globulin, Total 2.3 1.5 - 4.5 g/dL   Bilirubin Total 0.3 0.0 - 1.2 mg/dL   Alkaline Phosphatase 106 44 - 121 IU/L   AST 22 0 - 40 IU/L   ALT 16 0 - 32 IU/L  Lipid Panel w/o Chol/HDL Ratio   Collection Time: 01/25/23  8:44 AM  Result Value Ref Range   Cholesterol, Total 226 (H) 100 - 199 mg/dL   Triglycerides 806 (H) 0 - 149 mg/dL   HDL 53 >60 mg/dL   VLDL Cholesterol Cal 34 5 - 40 mg/dL   LDL Chol Calc (NIH) 860 (H) 0 - 99 mg/dL  TSH   Collection Time: 01/25/23  8:44 AM  Result Value Ref Range   TSH 2.240 0.450 - 4.500 uIU/mL  VITAMIN D  25 Hydroxy (Vit-D Deficiency, Fractures)   Collection Time: 01/25/23  8:44 AM  Result Value Ref Range   Vit D, 25-Hydroxy 27.7 (L) 30.0 - 100.0 ng/mL  HgB A1c   Collection Time: 01/25/23  8:44 AM  Result  Value Ref Range   Hgb A1c MFr Bld 5.9 (H) 4.8 - 5.6 %   Est. average glucose Bld gHb Est-mCnc 123 mg/dL      Assessment & Plan:   Problem List Items Addressed This Visit       Endocrine   IFG (impaired fasting glucose) - Primary   Noted on past labs, check A1c today and annually if levels in prediabetic range.      Relevant Orders   Bayer DCA Hb A1c Waived     Other   Vitamin D  deficiency   Chronic, stable.  Noted on past labs, continue supplements and check labs today.      Relevant Orders   VITAMIN D  25 Hydroxy (Vit-D Deficiency, Fractures)   Elevated low density lipoprotein (LDL) cholesterol level   Noted past labs, recheck today and start medications as needed in future. At present focus on healthy diet and regular exercise. The 10-year ASCVD risk score (Arnett DK, et al., 2019) is: 2.4%   Values used to calculate the score:     Age: 50 years     Clincally relevant sex: Female     Is Non-Hispanic African American: No     Diabetic: No     Tobacco smoker: No     Systolic Blood Pressure: 107 mmHg     Is BP treated: No     HDL Cholesterol: 53 mg/dL     Total Cholesterol: 226 mg/dL       Relevant Orders   Comprehensive metabolic panel with GFR   Lipid Panel w/o Chol/HDL Ratio   BMI 29.0-29.9,adult   BMI 28.44. Praised for her exercising 5 days a week, she has lost some weight  Recommended eating smaller high protein, low fat meals more frequently and exercising 30 mins a day 5 times a week with a goal of 10-15lb weight loss in the next 3 months. Patient voiced their understanding and motivation to adhere to these recommendations.       Other Visit Diagnoses       Pneumococcal vaccination given       PCV20 today, educated patient.   Relevant Orders   Pneumococcal conjugate vaccine 20-valent (Completed)     Encounter for annual physical exam       Annual physical today with labs and health maintenance reviewed, discussed with patient.   Relevant Orders   CBC  with Differential/Platelet   TSH        Follow up plan: Return in about 1 year (around 01/27/2025) for Annual Physical.   LABORATORY TESTING:  - Pap smear: Up To Date  IMMUNIZATIONS:   - Tdap: Tetanus vaccination status reviewed: Up To date - Influenza: Up to date - Pneumovax: Not applicable - Prevnar: Provided today - COVID: Up to date - HPV: Not applicable - Shingrix vaccine: Recommended, will think about this  SCREENING: -Mammogram: Up to date  - Colonoscopy: Up to date  - Bone Density: Not applicable  -Hearing Test: Not applicable  -Spirometry: Not applicable   PATIENT COUNSELING:   Advised to take 1 mg of folate supplement per day  if capable of pregnancy.   Sexuality: Discussed sexually transmitted diseases, partner selection, use of condoms, avoidance of unintended pregnancy  and contraceptive alternatives.   Advised to avoid cigarette smoking.  I discussed with the patient that most people either abstain from alcohol or drink within safe limits (<=14/week and <=4 drinks/occasion for males, <=7/weeks and <= 3 drinks/occasion for females) and that the risk for alcohol disorders and other health effects rises proportionally with the number of drinks per week and how often a drinker exceeds daily limits.  Discussed cessation/primary prevention of drug use and availability of treatment for abuse.   Diet: Encouraged to adjust caloric intake to maintain  or achieve ideal body weight, to reduce intake of dietary saturated fat and total fat, to limit sodium intake by avoiding high sodium foods and not adding table salt, and to maintain adequate dietary potassium and calcium preferably from fresh fruits, vegetables, and low-fat dairy products.    Stressed the importance of regular exercise  Injury prevention: Discussed safety belts, safety helmets, smoke detector, smoking near bedding or upholstery.   Dental health: Discussed importance of regular tooth brushing, flossing,  and dental visits.    NEXT PREVENTATIVE PHYSICAL DUE IN 1 YEAR. Return in about 1 year (around 01/27/2025) for Annual Physical.

## 2024-01-29 ENCOUNTER — Ambulatory Visit: Payer: Self-pay | Admitting: Nurse Practitioner

## 2024-01-29 LAB — CBC WITH DIFFERENTIAL/PLATELET
Basophils Absolute: 0.1 x10E3/uL (ref 0.0–0.2)
Basos: 1 %
EOS (ABSOLUTE): 0.1 x10E3/uL (ref 0.0–0.4)
Eos: 2 %
Hematocrit: 41 % (ref 34.0–46.6)
Hemoglobin: 13.5 g/dL (ref 11.1–15.9)
Immature Grans (Abs): 0 x10E3/uL (ref 0.0–0.1)
Immature Granulocytes: 0 %
Lymphocytes Absolute: 1.5 x10E3/uL (ref 0.7–3.1)
Lymphs: 27 %
MCH: 30.9 pg (ref 26.6–33.0)
MCHC: 32.9 g/dL (ref 31.5–35.7)
MCV: 94 fL (ref 79–97)
Monocytes Absolute: 0.4 x10E3/uL (ref 0.1–0.9)
Monocytes: 7 %
Neutrophils Absolute: 3.5 x10E3/uL (ref 1.4–7.0)
Neutrophils: 63 %
Platelets: 285 x10E3/uL (ref 150–450)
RBC: 4.37 x10E6/uL (ref 3.77–5.28)
RDW: 12.3 % (ref 11.7–15.4)
WBC: 5.6 x10E3/uL (ref 3.4–10.8)

## 2024-01-29 LAB — COMPREHENSIVE METABOLIC PANEL WITH GFR
ALT: 16 IU/L (ref 0–32)
AST: 20 IU/L (ref 0–40)
Albumin: 4.3 g/dL (ref 3.8–4.9)
Alkaline Phosphatase: 93 IU/L (ref 49–135)
BUN/Creatinine Ratio: 30 — ABNORMAL HIGH (ref 9–23)
BUN: 17 mg/dL (ref 6–24)
Bilirubin Total: 0.2 mg/dL (ref 0.0–1.2)
CO2: 23 mmol/L (ref 20–29)
Calcium: 9.3 mg/dL (ref 8.7–10.2)
Chloride: 106 mmol/L (ref 96–106)
Creatinine, Ser: 0.56 mg/dL — ABNORMAL LOW (ref 0.57–1.00)
Globulin, Total: 2.3 g/dL (ref 1.5–4.5)
Glucose: 99 mg/dL (ref 70–99)
Potassium: 4.2 mmol/L (ref 3.5–5.2)
Sodium: 139 mmol/L (ref 134–144)
Total Protein: 6.6 g/dL (ref 6.0–8.5)
eGFR: 105 mL/min/1.73 (ref >59)

## 2024-01-29 LAB — LIPID PANEL W/O CHOL/HDL RATIO
Cholesterol, Total: 228 mg/dL — ABNORMAL HIGH (ref 100–199)
HDL: 54 mg/dL (ref >39)
LDL Chol Calc (NIH): 129 mg/dL — ABNORMAL HIGH (ref 0–99)
Triglycerides: 257 mg/dL — ABNORMAL HIGH (ref 0–149)
VLDL Cholesterol Cal: 45 mg/dL — ABNORMAL HIGH (ref 5–40)

## 2024-01-29 LAB — TSH: TSH: 2.26 u[IU]/mL (ref 0.450–4.500)

## 2024-01-29 LAB — VITAMIN D 25 HYDROXY (VIT D DEFICIENCY, FRACTURES): Vit D, 25-Hydroxy: 22.9 ng/mL — ABNORMAL LOW (ref 30.0–100.0)

## 2024-01-29 NOTE — Progress Notes (Signed)
 Contacted via MyChart, but please call as speaks Spanish and unsure if family reads MyChart messages for her. Would need interpreter.  Good afternoon Leslie Spencer, your labs have returned and overall remain stable with exception of lipid panel and Vitamin D  level. Vitamin D  remains a little low, please ensure to take Vitamin D3 2000 units daily for bone health. Lipid panel continues to show elevations in cholesterol, for this no medications needed at this time. Continue focus on healthy diet and regular exercise. Any questions? Keep being stellar!!  Thank you for allowing me to participate in your care.  I appreciate you. Kindest regards, Wilena Tyndall

## 2024-01-31 NOTE — Progress Notes (Signed)
 Last read by Caldwell Hanly at 7:24PM on 01/29/2024.

## 2025-01-28 ENCOUNTER — Encounter: Admitting: Nurse Practitioner
